# Patient Record
Sex: Female | Born: 1975 | Race: Black or African American | Hispanic: No | Marital: Married | Smoking: Former smoker
Health system: Southern US, Community
[De-identification: ages and names within clinical notes are randomized; demographics above are authoritative.]

## PROBLEM LIST (undated history)

## (undated) DIAGNOSIS — B009 Herpesviral infection, unspecified: Secondary | ICD-10-CM

## (undated) DIAGNOSIS — A749 Chlamydial infection, unspecified: Secondary | ICD-10-CM

## (undated) DIAGNOSIS — K5730 Diverticulosis of large intestine without perforation or abscess without bleeding: Secondary | ICD-10-CM

## (undated) DIAGNOSIS — IMO0002 Reserved for concepts with insufficient information to code with codable children: Secondary | ICD-10-CM

## (undated) DIAGNOSIS — K219 Gastro-esophageal reflux disease without esophagitis: Secondary | ICD-10-CM

## (undated) DIAGNOSIS — D259 Leiomyoma of uterus, unspecified: Secondary | ICD-10-CM

## (undated) DIAGNOSIS — N920 Excessive and frequent menstruation with regular cycle: Secondary | ICD-10-CM

## (undated) DIAGNOSIS — I10 Essential (primary) hypertension: Secondary | ICD-10-CM

## (undated) DIAGNOSIS — Z972 Presence of dental prosthetic device (complete) (partial): Secondary | ICD-10-CM

## (undated) HISTORY — PX: COLONOSCOPY WITH ESOPHAGOGASTRODUODENOSCOPY (EGD): SHX5779

## (undated) HISTORY — PX: TOOTH EXTRACTION: SUR596

## (undated) HISTORY — DX: Essential (primary) hypertension: I10

## (undated) HISTORY — DX: Reserved for concepts with insufficient information to code with codable children: IMO0002

## (undated) HISTORY — DX: Gastro-esophageal reflux disease without esophagitis: K21.9

## (undated) HISTORY — DX: Herpesviral infection, unspecified: B00.9

## (undated) HISTORY — DX: Chlamydial infection, unspecified: A74.9

---

## 1998-08-24 DIAGNOSIS — A749 Chlamydial infection, unspecified: Secondary | ICD-10-CM

## 1998-08-24 DIAGNOSIS — Z8619 Personal history of other infectious and parasitic diseases: Secondary | ICD-10-CM

## 1998-08-24 HISTORY — DX: Personal history of other infectious and parasitic diseases: Z86.19

## 1998-08-24 HISTORY — DX: Chlamydial infection, unspecified: A74.9

## 1998-09-01 ENCOUNTER — Encounter: Payer: Self-pay | Admitting: Emergency Medicine

## 1998-09-01 ENCOUNTER — Emergency Department (HOSPITAL_COMMUNITY): Admission: EM | Admit: 1998-09-01 | Discharge: 1998-09-01 | Payer: Self-pay | Admitting: Emergency Medicine

## 1999-06-16 ENCOUNTER — Emergency Department (HOSPITAL_COMMUNITY): Admission: EM | Admit: 1999-06-16 | Discharge: 1999-06-16 | Payer: Self-pay | Admitting: Emergency Medicine

## 1999-06-16 ENCOUNTER — Encounter: Payer: Self-pay | Admitting: Emergency Medicine

## 1999-08-25 DIAGNOSIS — IMO0002 Reserved for concepts with insufficient information to code with codable children: Secondary | ICD-10-CM

## 1999-08-25 HISTORY — DX: Reserved for concepts with insufficient information to code with codable children: IMO0002

## 2001-05-03 ENCOUNTER — Other Ambulatory Visit: Admission: RE | Admit: 2001-05-03 | Discharge: 2001-05-03 | Payer: Self-pay | Admitting: Obstetrics and Gynecology

## 2001-08-24 DIAGNOSIS — Z87410 Personal history of cervical dysplasia: Secondary | ICD-10-CM

## 2001-08-24 HISTORY — PX: LEEP: SHX91

## 2001-08-24 HISTORY — DX: Personal history of cervical dysplasia: Z87.410

## 2001-11-22 ENCOUNTER — Other Ambulatory Visit: Admission: RE | Admit: 2001-11-22 | Discharge: 2001-11-22 | Payer: Self-pay | Admitting: Obstetrics and Gynecology

## 2002-03-09 ENCOUNTER — Ambulatory Visit (HOSPITAL_COMMUNITY): Admission: RE | Admit: 2002-03-09 | Discharge: 2002-03-09 | Payer: Self-pay | Admitting: Obstetrics and Gynecology

## 2002-03-09 ENCOUNTER — Encounter: Payer: Self-pay | Admitting: Obstetrics and Gynecology

## 2002-03-29 ENCOUNTER — Ambulatory Visit (HOSPITAL_COMMUNITY): Admission: RE | Admit: 2002-03-29 | Discharge: 2002-03-29 | Payer: Self-pay | Admitting: Obstetrics and Gynecology

## 2002-03-29 ENCOUNTER — Encounter: Payer: Self-pay | Admitting: Obstetrics and Gynecology

## 2002-04-20 ENCOUNTER — Other Ambulatory Visit: Admission: RE | Admit: 2002-04-20 | Discharge: 2002-04-20 | Payer: Self-pay | Admitting: Obstetrics and Gynecology

## 2002-06-09 ENCOUNTER — Ambulatory Visit (HOSPITAL_COMMUNITY): Admission: RE | Admit: 2002-06-09 | Discharge: 2002-06-09 | Payer: Self-pay | Admitting: Obstetrics and Gynecology

## 2002-08-11 ENCOUNTER — Inpatient Hospital Stay (HOSPITAL_COMMUNITY): Admission: AD | Admit: 2002-08-11 | Discharge: 2002-08-11 | Payer: Self-pay | Admitting: Obstetrics and Gynecology

## 2002-08-21 ENCOUNTER — Ambulatory Visit (HOSPITAL_COMMUNITY): Admission: RE | Admit: 2002-08-21 | Discharge: 2002-08-21 | Payer: Self-pay | Admitting: Obstetrics and Gynecology

## 2002-08-21 ENCOUNTER — Encounter: Payer: Self-pay | Admitting: Obstetrics and Gynecology

## 2002-08-31 ENCOUNTER — Other Ambulatory Visit: Admission: RE | Admit: 2002-08-31 | Discharge: 2002-08-31 | Payer: Self-pay | Admitting: *Deleted

## 2002-10-25 ENCOUNTER — Ambulatory Visit (HOSPITAL_COMMUNITY): Admission: RE | Admit: 2002-10-25 | Discharge: 2002-10-25 | Payer: Self-pay | Admitting: Obstetrics and Gynecology

## 2002-10-25 ENCOUNTER — Encounter: Payer: Self-pay | Admitting: Obstetrics and Gynecology

## 2002-10-30 ENCOUNTER — Inpatient Hospital Stay (HOSPITAL_COMMUNITY): Admission: AD | Admit: 2002-10-30 | Discharge: 2002-10-30 | Payer: Self-pay | Admitting: Obstetrics and Gynecology

## 2003-01-24 ENCOUNTER — Inpatient Hospital Stay (HOSPITAL_COMMUNITY): Admission: AD | Admit: 2003-01-24 | Discharge: 2003-01-24 | Payer: Self-pay | Admitting: Obstetrics and Gynecology

## 2003-01-29 ENCOUNTER — Ambulatory Visit (HOSPITAL_COMMUNITY): Admission: RE | Admit: 2003-01-29 | Discharge: 2003-01-29 | Payer: Self-pay | Admitting: Obstetrics and Gynecology

## 2003-01-29 ENCOUNTER — Inpatient Hospital Stay (HOSPITAL_COMMUNITY): Admission: AD | Admit: 2003-01-29 | Discharge: 2003-01-29 | Payer: Self-pay | Admitting: Obstetrics and Gynecology

## 2003-01-29 ENCOUNTER — Encounter: Payer: Self-pay | Admitting: Obstetrics and Gynecology

## 2003-01-30 ENCOUNTER — Inpatient Hospital Stay (HOSPITAL_COMMUNITY): Admission: AD | Admit: 2003-01-30 | Discharge: 2003-01-30 | Payer: Self-pay | Admitting: Obstetrics and Gynecology

## 2003-03-07 ENCOUNTER — Inpatient Hospital Stay (HOSPITAL_COMMUNITY): Admission: AD | Admit: 2003-03-07 | Discharge: 2003-03-10 | Payer: Self-pay | Admitting: Obstetrics and Gynecology

## 2003-03-09 ENCOUNTER — Encounter: Payer: Self-pay | Admitting: Obstetrics and Gynecology

## 2003-05-30 ENCOUNTER — Other Ambulatory Visit: Admission: RE | Admit: 2003-05-30 | Discharge: 2003-05-30 | Payer: Self-pay | Admitting: Obstetrics and Gynecology

## 2004-07-04 ENCOUNTER — Other Ambulatory Visit: Admission: RE | Admit: 2004-07-04 | Discharge: 2004-07-04 | Payer: Self-pay | Admitting: Obstetrics and Gynecology

## 2005-02-06 ENCOUNTER — Other Ambulatory Visit: Admission: RE | Admit: 2005-02-06 | Discharge: 2005-02-06 | Payer: Self-pay | Admitting: Obstetrics and Gynecology

## 2005-04-06 ENCOUNTER — Encounter: Admission: RE | Admit: 2005-04-06 | Discharge: 2005-04-06 | Payer: Self-pay | Admitting: Internal Medicine

## 2005-09-29 ENCOUNTER — Other Ambulatory Visit: Admission: RE | Admit: 2005-09-29 | Discharge: 2005-09-29 | Payer: Self-pay | Admitting: Obstetrics and Gynecology

## 2012-09-07 ENCOUNTER — Encounter: Payer: Self-pay | Admitting: Obstetrics and Gynecology

## 2012-09-12 ENCOUNTER — Encounter: Payer: Self-pay | Admitting: Obstetrics and Gynecology

## 2012-09-12 ENCOUNTER — Ambulatory Visit: Payer: Managed Care, Other (non HMO) | Admitting: Obstetrics and Gynecology

## 2012-09-12 VITALS — BP 122/82 | HR 70 | Resp 16 | Ht 69.0 in | Wt 217.0 lb

## 2012-09-12 DIAGNOSIS — Z124 Encounter for screening for malignant neoplasm of cervix: Secondary | ICD-10-CM

## 2012-09-12 NOTE — Progress Notes (Signed)
Last Pap 05/27/2011 WNL: Yes Regular Periods:yes Contraception: None  Monthly Breast exam:no Tetanus<79yrs:no Nl.Bladder Function:yes Daily BMs:yes Healthy Diet:yes Calcium:no Mammogram:no Date of Mammogram: None Exercise:no Have often Exercise: None Seatbelt: yes Abuse at home: no Stressful work:no Sigmoid-colonoscopy: None Bone Density: No PCP: Shaune Pollack Change in PMH: None Change in ZOX:WRUEAV DX with Colon Cancer 10/2011 BP 122/82  Pulse 70  Resp 16  Ht 5\' 9"  (1.753 m)  Wt 217 lb (98.431 kg)  BMI 32.05 kg/m2  LMP 08/26/2012 Pt with complaints:yes and pt is trying to concieve.  No BC for one year.  IC 2-3 times per week.  No lubricants Physical Examination: General appearance - alert, well appearing, and in no distress Mental status - normal mood, behavior, speech, dress, motor activity, and thought processes Neck - supple, no significant adenopathy,  thyroid exam: thyroid is normal in size without nodules or tenderness Chest - clear to auscultation, no wheezes, rales or rhonchi, symmetric air entry Heart - normal rate and regular rhythm Abdomen - soft, nontender, nondistended, no masses or organomegaly Breasts - breasts appear normal, no suspicious masses, no skin or nipple changes or axillary nodes Pelvic - normal external genitalia, vulva, vagina, cervix, uterus and adnexa Rectal - rectal exam not indicated Back exam - full range of motion, no tenderness, palpable spasm or pain on motion Neurological - alert, oriented, normal speech, no focal findings or movement disorder noted Musculoskeletal - no joint tenderness, deformity or swelling Extremities - no edema, redness or tenderness in the calves or thighs Skin - normal coloration and turgor, no rashes, no suspicious skin lesions noted Routine exam Pap sent h/o CIN 3 Mammogram due no nothing used for contraception Call with menses to schedule day 21 labs, FBS, insulin, tsh and progesterone Schedule HSG.  Rt one  week after labs drawn Do SA RT will schedule

## 2012-09-13 LAB — PAP IG, CT-NG, RFX HPV ASCU

## 2012-09-19 ENCOUNTER — Telehealth: Payer: Self-pay

## 2012-09-19 MED ORDER — METRONIDAZOLE 0.75 % VA GEL: VAGINAL | Status: DC

## 2012-09-19 NOTE — Telephone Encounter (Signed)
Spoke with pt informed labs rx sent to pharm for BV pt voice understanding

## 2012-09-19 NOTE — Telephone Encounter (Signed)
Message copied by Rolla Plate on Mon Sep 19, 2012 10:13 AM ------      Message from: Jaymes Graff      Created: Tue Sep 13, 2012  5:35 PM       Please tell pt BV was found on her pap smear.  She can be treated with either Metrogel one applicator in vagina QHS for five nights or Flagyl 500mg  one tablet twice a day for seven days.

## 2012-09-26 ENCOUNTER — Telehealth: Payer: Self-pay | Admitting: Obstetrics and Gynecology

## 2012-09-26 ENCOUNTER — Other Ambulatory Visit: Payer: Self-pay | Admitting: Obstetrics and Gynecology

## 2012-09-26 DIAGNOSIS — N971 Female infertility of tubal origin: Secondary | ICD-10-CM

## 2012-09-26 DIAGNOSIS — N970 Female infertility associated with anovulation: Secondary | ICD-10-CM

## 2012-09-26 NOTE — Telephone Encounter (Signed)
ND pt 

## 2012-09-26 NOTE — Telephone Encounter (Signed)
Spoke with pt rgd msg pt states started cycle 09/25/12 pt to call first day of cycle pt sch for HSG 10/04/12 at 8:30 at [facility] hospital pt day 21 labs schd 10/15/12 advised pt can go to [facility]  [address] since d 21 falls on Saturday orders faxed to lab and mailed to pt pt voice understanding

## 2012-09-26 NOTE — Telephone Encounter (Signed)
Lm on vm tcb rgd msg 

## 2012-10-04 ENCOUNTER — Ambulatory Visit (HOSPITAL_COMMUNITY)
Admission: RE | Admit: 2012-10-04 | Discharge: 2012-10-04 | Disposition: A | Discharge status: Home / Self Care | Payer: Managed Care, Other (non HMO) | Source: Ambulatory Visit | Attending: Obstetrics and Gynecology | Admitting: Obstetrics and Gynecology

## 2012-10-04 ENCOUNTER — Ambulatory Visit (HOSPITAL_COMMUNITY): Payer: Self-pay

## 2012-10-04 DIAGNOSIS — N979 Female infertility, unspecified: Secondary | ICD-10-CM | POA: Insufficient documentation

## 2012-10-04 DIAGNOSIS — N970 Female infertility associated with anovulation: Secondary | ICD-10-CM

## 2012-10-04 MED ORDER — IOHEXOL 300 MG/ML  SOLN
20.0000 mL | Freq: Once | INTRAMUSCULAR | Status: AC | PRN
  Administered 2012-10-04: 8 mL

## 2012-10-11 ENCOUNTER — Telehealth: Payer: Self-pay

## 2012-10-11 NOTE — Telephone Encounter (Signed)
Message copied by Rolla Plate on Tue Oct 11, 2012  9:16 AM ------      Message from: Jaymes Graff      Created: Wed Oct 05, 2012 12:26 AM       Please make sure the pt comes back for follow up as scheduled ------

## 2012-10-11 NOTE — Telephone Encounter (Signed)
Lm on vm tcb rgd appt

## 2012-10-12 NOTE — Telephone Encounter (Signed)
Spoke with pt rgd f/u appt to discuss infertility pt has appt 10/20/12 at 3:15 with nd pt voice understanding

## 2012-10-15 ENCOUNTER — Other Ambulatory Visit: Payer: Self-pay | Admitting: Obstetrics and Gynecology

## 2012-10-16 LAB — INSULIN, FASTING: Insulin fasting, serum: 21 u[IU]/mL (ref 3–28)

## 2012-10-20 ENCOUNTER — Ambulatory Visit: Payer: Managed Care, Other (non HMO) | Admitting: Obstetrics and Gynecology

## 2012-10-20 ENCOUNTER — Encounter: Payer: Self-pay | Admitting: Obstetrics and Gynecology

## 2012-10-20 VITALS — BP 118/76 | Ht 69.0 in | Wt 216.0 lb

## 2012-10-20 DIAGNOSIS — Z3169 Encounter for other general counseling and advice on procreation: Secondary | ICD-10-CM

## 2012-10-20 NOTE — Progress Notes (Signed)
F/u labs infertility BP 118/76  Ht 5\' 9"  (1.753 m)  Wt 216 lb (97.977 kg)  BMI 31.88 kg/m2  LMP 09/25/2012 Results for orders placed in visit on 10/15/12  INSULIN, FASTING      Result Value Range   Insulin fasting, serum 21  3 - 28 uIU/mL  day 21 progesterone 19.4 FBS 82 TSH WNL HSG normal uterus.  Normal left tube with spill.  Right tube with spill but dilated Infertility Still waiting on SA Discussed clomid with and without IUI Discussed L/S with chromopertubation and poss salpingectomy Pt will call with decision Pt with some insulin resistance but she declined metformin

## 2014-05-21 ENCOUNTER — Other Ambulatory Visit: Payer: Self-pay | Admitting: Obstetrics and Gynecology

## 2014-06-25 ENCOUNTER — Encounter: Payer: Self-pay | Admitting: Obstetrics and Gynecology

## 2014-07-11 ENCOUNTER — Other Ambulatory Visit: Payer: Self-pay | Admitting: Obstetrics and Gynecology

## 2014-07-13 ENCOUNTER — Other Ambulatory Visit (HOSPITAL_COMMUNITY): Payer: Self-pay | Admitting: Obstetrics and Gynecology

## 2014-07-13 NOTE — H&P (Signed)
Ashley Wagner is a 38 y.o. female P 1-0-0-1 presents for hysteroscopy, diliatation and  curettage because of menorrhagia.  The patient, previously on oral contraceptives, stopped them two years ago in preparation to conceive. Since that time she's had worsening menstrual flow lasting for 7 days with pad change every 1-2 hours with clots. On occasion she will spot as well as post coitally.  She reports cramping that she rates as a 7/10 on a 10 point pain scale with no relief from over the counter analgesia.  She denies any vaginitis symptoms, changes in bowel or bladder function or dyspareunia.  A Sono-hysterogram done in September 2015 showed a uterus: 9.04 x 6.36 x 5.51 cm;   #2 fibroids: 1.1 and 7 mm;  an irregular endometrium with a 1 cm area of thickness possibly related to  C-section scar;  with saline infusion several polyps were noted on the posterior side of the endometrial wall with the largest measuring 1.8 cm;   endometrial cavity was 3.9 x 2.2 cm;    both ovaries appeared normal on this study.  An endometrial biopsy done at the same time showed: benign secretory endometrium with no malgnancy or hyperplasia. A review management options were given the patient, in view of the  radiographic findings and her desire to conceive.  After careful consideration she has chosen to proceed with hysteroscopy, dilatation and curettage.   Past Medical History    OB History: G: 1;  P 1-0-0-1;  C-section 2004  GYN History: menarche: 38 YO    LMP: 06/24/2014    Contracepton no method  The patient reports a past history of: gonorrhea and herpes.  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   ROS: Admits to glasses but  denies headache, vision changes, nasal congestion, dysphagia, tinnitus, dizziness, hoarseness, cough,  chest pain, shortness of breath, nausea, vomiting, diarrhea,constipation,  urinary frequency, urgency  dysuria, hematuria, vaginitis symptoms, pelvic pain, swelling of joints,easy bruising,  myalgias, arthralgias, skin rashes, unexplained weight loss and except as is mentioned in the history of present illness, patient's review of systems is otherwise negative.   Physical Exam  Bp: 130/86   Weight: 210 lbs   Height: 5\' 8"   BMI: 31.9 Neck: supple without masses or thyromegaly Lungs: clear to auscultation Heart: regular rate and rhythm Abdomen: soft, mildly ttender  without guarding and no organomegaly Pelvic:EGBUS- wnl; vagina-normal rugae; uterus-upper limits of normal size and mildly tender, cervix without lesions or motion tenderness; adnexae-no tenderness or masses Extremities:  no clubbing, cyanosis or edema   Assesment:  Menorrhagia                       Endometrial Polyps   Disposition:  A discussion was held with patient regarding the indication for her procedure(s) along with the risks, which include but are not limited to: reaction to anesthesia, damage to adjacent organs, infection and excessive bleeding. The patient verbalized understanding of these  risks and has consented to proceed with a Hysteroscopy, Dilatation and Curettage with Tru-Clear  at [facility] on August 03, 2014 at 10 a.m.  CSN# [id number]   Justn Quale J. Florene Glen, PA-C  for Dr. Franklyn Lor. Dillard

## 2014-08-22 ENCOUNTER — Other Ambulatory Visit: Payer: Self-pay | Admitting: Obstetrics and Gynecology

## 2014-08-22 ENCOUNTER — Encounter (HOSPITAL_COMMUNITY): Payer: Self-pay | Admitting: Obstetrics and Gynecology

## 2014-08-27 ENCOUNTER — Other Ambulatory Visit (HOSPITAL_COMMUNITY): Payer: Self-pay | Admitting: Obstetrics and Gynecology

## 2014-08-27 NOTE — H&P (Signed)
Ashley Wagner is a 39 y.o.  female P 1-0-0-1 presents for hysteroscopy,  and removal of endometrial masses,  because of menorrhagia and endometrial polyps. Over the years the patient has had increasing menstrual flow that will last for 7 days, accompanied by clots and require the change of a pad every 2 hours.  Additionally she will have cramping rated 3-10/10 on a 10 point pain scale but is relieved with over the counter NSAIDs.  She goes on to report post coital spotting lasting 1-3 days that is also accompanied by some cramping.  She denies, howver, non-bleeding  related pelvic pain, dyspareunia,  urinary or bowel changes.  A pelvic ultrasound/sono-hysterogram in September 2015 showed a uterus: 9.04 x 6.36 x 5.51 cm with # 2 fibroids: 1.1 cm and 7 mm.  The endometrium, measured  1.10 cm was irregular with thickness of a prior C-section scar measuring 1 cm.  With saline infusion several polyps on the posterior side of the endometrial wall were seen and the largest measuring 1.8 cm.  The endometrial cavity measured: 3.9 x 2.2 cm and  both ovaries appeared normal.  An endometrial biopsy done at the same time returned benign secretory endometrium with no malignancy, aypia or hyperplasia. Last year a TSH was normal.  The patient was given both medical and surgical management options for her symptoms and endometrial polyps however, she wants to proceed with surgical removal of her endometrial polyps.   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  Abnormal PAP smear 2003 treated with LEEP-normal since   Last PAP smear: 03/2014  Medical History: Hypertension, Insulin Resistance, Infertility  Surgical History: 2003 Loop Electrosurgical Excision Procedure Denies history of blood transfusions;  reports that previous epidural with childbirth didn't work and  was difficult to awaken from general anesthesia  Family History: Colon Cancer and Hypertension  Social History: Married and employed with [employer];  Former Smoker and Occasional Alcohol   Medications: Valacyclovir 500 mg bid x 5 days prn Naproxen 550 mg bid pc prn Flonase 2 sprays per nostril as directed  No Known Allergies   Denies sensitivity to peanuts, shellfish, soy, latex or adhesives.   ROS: Admits to glasses, upper dentures, right ear tinnitus but  denies headache, vision changes, nasal congestion, dysphagia, dizziness, hoarseness, cough,  chest pain, shortness of breath, nausea, vomiting, diarrhea,constipation,  urinary frequency, urgency  dysuria, hematuria, vaginitis symptoms, pelvic pain, swelling of joints,easy bruising,  myalgias, arthralgias, skin rashes, unexplained weight loss and except as is mentioned in the history of present illness, patient's review of systems is otherwise negative.   Physical Exam  Bp: 132/78    P: 64   R: 20  Temperature: 98.4 degrees F orally  Weight: 219 lbs.  Height: 5\' 8"    BMI: 33.3  Neck: supple without masses or thyromegaly Lungs: clear to auscultation Heart: regular rate and rhythm Abdomen: soft, non-tender and no organomegaly Pelvic:EGBUS- wnl; vagina-normal rugae; uterus-normal size, cervix without lesions or motion tenderness; adnexae-no tenderness or masses Extremities:  no clubbing, cyanosis or edema   Assesment: Menorrhagia                       Endometrial Polyps   Disposition:  A discussion was held with patient regarding the indication for her procedure(s) along with the  risks, which include but are not limited to: reaction to anesthesia, damage to adjacent organs to include perforation, scarring of endometrium,  infection and excessive bleeding. The patient verbalized understanding of these risks and has consented to proceed with Hysteroscopy, Dilatiation, Curettage, and Resection of Endometrial Polyps with Tru  Clear on September 07, 2014 at [place].   CSN# [id number]   [name] J. Florene Glen, PA-C  for Dr.Naima A. Dillard

## 2014-08-30 ENCOUNTER — Encounter (HOSPITAL_COMMUNITY): Payer: Self-pay | Admitting: *Deleted

## 2014-09-07 ENCOUNTER — Encounter (HOSPITAL_COMMUNITY): Payer: Self-pay | Admitting: Anesthesiology

## 2014-09-07 ENCOUNTER — Ambulatory Visit (HOSPITAL_COMMUNITY): Payer: BLUE CROSS/BLUE SHIELD | Admitting: Anesthesiology

## 2014-09-07 ENCOUNTER — Ambulatory Visit (HOSPITAL_COMMUNITY)
Admission: RE | Admit: 2014-09-07 | Discharge: 2014-09-07 | Disposition: A | Discharge status: Home / Self Care | Payer: BLUE CROSS/BLUE SHIELD | Source: Ambulatory Visit | Attending: Obstetrics and Gynecology | Admitting: Obstetrics and Gynecology

## 2014-09-07 ENCOUNTER — Encounter (HOSPITAL_COMMUNITY)
Admission: RE | Disposition: A | Discharge status: Home / Self Care | Payer: Self-pay | Source: Ambulatory Visit | Attending: Obstetrics and Gynecology

## 2014-09-07 DIAGNOSIS — N840 Polyp of corpus uteri: Secondary | ICD-10-CM | POA: Diagnosis not present

## 2014-09-07 DIAGNOSIS — I10 Essential (primary) hypertension: Secondary | ICD-10-CM | POA: Diagnosis not present

## 2014-09-07 DIAGNOSIS — Z87891 Personal history of nicotine dependence: Secondary | ICD-10-CM | POA: Diagnosis not present

## 2014-09-07 DIAGNOSIS — E669 Obesity, unspecified: Secondary | ICD-10-CM | POA: Insufficient documentation

## 2014-09-07 DIAGNOSIS — Z8249 Family history of ischemic heart disease and other diseases of the circulatory system: Secondary | ICD-10-CM | POA: Insufficient documentation

## 2014-09-07 DIAGNOSIS — Z79899 Other long term (current) drug therapy: Secondary | ICD-10-CM | POA: Insufficient documentation

## 2014-09-07 DIAGNOSIS — Z6831 Body mass index (BMI) 31.0-31.9, adult: Secondary | ICD-10-CM | POA: Diagnosis not present

## 2014-09-07 DIAGNOSIS — N920 Excessive and frequent menstruation with regular cycle: Secondary | ICD-10-CM | POA: Diagnosis present

## 2014-09-07 HISTORY — PX: DILATATION & CURETTAGE/HYSTEROSCOPY WITH TRUECLEAR: SHX6353

## 2014-09-07 LAB — CBC
HEMATOCRIT: 40 % (ref 36.0–46.0)
Hemoglobin: 12.9 g/dL (ref 12.0–15.0)
MCH: 30.1 pg (ref 26.0–34.0)
MCHC: 32.3 g/dL (ref 30.0–36.0)
MCV: 93.2 fL (ref 78.0–100.0)
Platelets: 288 10*3/uL (ref 150–400)
RBC: 4.29 MIL/uL (ref 3.87–5.11)
RDW: 14 % (ref 11.5–15.5)
WBC: 9 10*3/uL (ref 4.0–10.5)

## 2014-09-07 LAB — PREGNANCY, URINE: Preg Test, Ur: NEGATIVE

## 2014-09-07 SURGERY — DILATATION & CURETTAGE/HYSTEROSCOPY WITH TRUCLEAR
Anesthesia: General | Site: Vagina

## 2014-09-07 MED ORDER — SCOPOLAMINE 1 MG/3DAYS TD PT72
1.0000 | MEDICATED_PATCH | Freq: Once | TRANSDERMAL | Status: DC
  Administered 2014-09-07: 1.5 mg via TRANSDERMAL

## 2014-09-07 MED ORDER — LACTATED RINGERS IV SOLN
INTRAVENOUS | Status: DC
  Administered 2014-09-07 (×2): via INTRAVENOUS

## 2014-09-07 MED ORDER — FENTANYL CITRATE 0.05 MG/ML IJ SOLN
INTRAMUSCULAR | Status: DC | PRN
  Administered 2014-09-07 (×5): 50 ug via INTRAVENOUS

## 2014-09-07 MED ORDER — SCOPOLAMINE 1 MG/3DAYS TD PT72
MEDICATED_PATCH | TRANSDERMAL | Status: AC
  Administered 2014-09-07: 1.5 mg via TRANSDERMAL
  Filled 2014-09-07: qty 1

## 2014-09-07 MED ORDER — LIDOCAINE HCL 2 % IJ SOLN
INTRAMUSCULAR | Status: AC
  Filled 2014-09-07: qty 20

## 2014-09-07 MED ORDER — MIDAZOLAM HCL 2 MG/2ML IJ SOLN
INTRAMUSCULAR | Status: DC | PRN
  Administered 2014-09-07: 2 mg via INTRAVENOUS

## 2014-09-07 MED ORDER — DOXYCYCLINE HYCLATE 50 MG PO CAPS: 100.0000 mg | ORAL_CAPSULE | Freq: Two times a day (BID) | ORAL | Status: AC

## 2014-09-07 MED ORDER — FENTANYL CITRATE 0.05 MG/ML IJ SOLN
INTRAMUSCULAR | Status: AC
  Filled 2014-09-07: qty 2

## 2014-09-07 MED ORDER — MIDAZOLAM HCL 2 MG/2ML IJ SOLN
INTRAMUSCULAR | Status: AC
  Filled 2014-09-07: qty 2

## 2014-09-07 MED ORDER — FENTANYL CITRATE 0.05 MG/ML IJ SOLN
25.0000 ug | INTRAMUSCULAR | Status: DC | PRN
  Administered 2014-09-07: 50 ug via INTRAVENOUS

## 2014-09-07 MED ORDER — LIDOCAINE HCL 2 % IJ SOLN
INTRAMUSCULAR | Status: DC | PRN
  Administered 2014-09-07: 19 mL

## 2014-09-07 MED ORDER — DEXAMETHASONE SODIUM PHOSPHATE 10 MG/ML IJ SOLN
INTRAMUSCULAR | Status: AC
  Filled 2014-09-07: qty 1

## 2014-09-07 MED ORDER — DEXAMETHASONE SODIUM PHOSPHATE 4 MG/ML IJ SOLN
INTRAMUSCULAR | Status: DC | PRN
  Administered 2014-09-07: 4 mg via INTRAVENOUS

## 2014-09-07 MED ORDER — LIDOCAINE HCL (CARDIAC) 20 MG/ML IV SOLN
INTRAVENOUS | Status: AC
  Filled 2014-09-07: qty 5

## 2014-09-07 MED ORDER — METOCLOPRAMIDE HCL 5 MG/ML IJ SOLN: 10.0000 mg | Freq: Once | INTRAMUSCULAR | Status: DC | PRN

## 2014-09-07 MED ORDER — SILVER NITRATE-POT NITRATE 75-25 % EX MISC
CUTANEOUS | Status: AC
  Filled 2014-09-07: qty 1

## 2014-09-07 MED ORDER — PROPOFOL 10 MG/ML IV BOLUS
INTRAVENOUS | Status: AC
  Filled 2014-09-07: qty 20

## 2014-09-07 MED ORDER — MEPERIDINE HCL 25 MG/ML IJ SOLN: 6.2500 mg | INTRAMUSCULAR | Status: DC | PRN

## 2014-09-07 MED ORDER — PROPOFOL 10 MG/ML IV BOLUS
INTRAVENOUS | Status: DC | PRN
  Administered 2014-09-07: 200 mg via INTRAVENOUS

## 2014-09-07 MED ORDER — FENTANYL CITRATE 0.05 MG/ML IJ SOLN
INTRAMUSCULAR | Status: AC
  Filled 2014-09-07: qty 5

## 2014-09-07 MED ORDER — LIDOCAINE HCL (CARDIAC) 20 MG/ML IV SOLN
INTRAVENOUS | Status: DC | PRN
  Administered 2014-09-07: 80 mg via INTRAVENOUS

## 2014-09-07 MED ORDER — HYDROCODONE-ACETAMINOPHEN 5-325 MG PO TABS: 1.0000 | ORAL_TABLET | Freq: Four times a day (QID) | ORAL | Status: DC | PRN

## 2014-09-07 MED ORDER — KETOROLAC TROMETHAMINE 30 MG/ML IJ SOLN
INTRAMUSCULAR | Status: DC | PRN
  Administered 2014-09-07: 30 mg via INTRAVENOUS

## 2014-09-07 MED ORDER — ONDANSETRON HCL 4 MG/2ML IJ SOLN
INTRAMUSCULAR | Status: AC
Start: 2014-09-07 — End: 2014-09-07
  Filled 2014-09-07: qty 2

## 2014-09-07 MED ORDER — ONDANSETRON HCL 4 MG/2ML IJ SOLN
INTRAMUSCULAR | Status: DC | PRN
  Administered 2014-09-07: 4 mg via INTRAVENOUS

## 2014-09-07 SURGICAL SUPPLY — 17 items
BLADE INCISOR TRUC PLUS 2.9 (ABLATOR) ×1 IMPLANT
CANISTERS HI-FLOW 3000CC (CANNISTER) ×2 IMPLANT
CATH ROBINSON RED A/P 16FR (CATHETERS) ×2 IMPLANT
CLOTH BEACON ORANGE TIMEOUT ST (SAFETY) ×2 IMPLANT
CONTAINER PREFILL 10% NBF 60ML (FORM) ×4 IMPLANT
GLOVE BIO SURGEON STRL SZ 6.5 (GLOVE) ×2 IMPLANT
GLOVE BIOGEL PI IND STRL 7.0 (GLOVE) ×1 IMPLANT
GLOVE BIOGEL PI INDICATOR 7.0 (GLOVE) ×1
GOWN STRL REUS W/TWL LRG LVL3 (GOWN DISPOSABLE) ×4 IMPLANT
INCISOR TRUC PLUS BLADE 2.9 (ABLATOR) ×2
KIT HYSTEROSCOPY TRUCLEAR (ABLATOR) ×2 IMPLANT
MORCELLATOR RECIP TRUCLEAR 4.0 (ABLATOR) IMPLANT
PACK VAGINAL MINOR WOMEN LF (CUSTOM PROCEDURE TRAY) ×2 IMPLANT
PAD OB MATERNITY 4.3X12.25 (PERSONAL CARE ITEMS) ×2 IMPLANT
SYR 20CC LL (SYRINGE) ×2 IMPLANT
TOWEL OR 17X24 6PK STRL BLUE (TOWEL DISPOSABLE) ×4 IMPLANT
WATER STERILE IRR 1000ML POUR (IV SOLUTION) ×2 IMPLANT

## 2014-09-07 NOTE — Anesthesia Procedure Notes (Signed)
Procedure Name: LMA Insertion Date/Time: 09/07/2014 9:34 AM Performed by: Flossie Dibble Pre-anesthesia Checklist: Patient identified, Timeout performed, Emergency Drugs available, Suction available and Patient being monitored Patient Re-evaluated:Patient Re-evaluated prior to inductionOxygen Delivery Method: Circle system utilized Preoxygenation: Pre-oxygenation with 100% oxygen Intubation Type: IV induction LMA Size: 4.0 Number of attempts: 1 Placement Confirmation: breath sounds checked- equal and bilateral and positive ETCO2 Tube secured with: Tape Dental Injury: Teeth and Oropharynx as per pre-operative assessment

## 2014-09-07 NOTE — H&P (View-Only) (Signed)
Ashley Wagner is a 39 y.o.  female P 1-0-0-1 presents for hysteroscopy,  and removal of endometrial masses,  because of menorrhagia and endometrial polyps. Over the years the patient has had increasing menstrual flow that will last for 7 days, accompanied by clots and require the change of a pad every 2 hours.  Additionally she will have cramping rated 3-10/10 on a 10 point pain scale but is relieved with over the counter NSAIDs.  She goes on to report post coital spotting lasting 1-3 days that is also accompanied by some cramping.  She denies, howver, non-bleeding  related pelvic pain, dyspareunia,  urinary or bowel changes.  A pelvic ultrasound/sono-hysterogram in September 2015 showed a uterus: 9.04 x 6.36 x 5.51 cm with # 2 fibroids: 1.1 cm and 7 mm.  The endometrium, measured  1.10 cm was irregular with thickness of a prior C-section scar measuring 1 cm.  With saline infusion several polyps on the posterior side of the endometrial wall were seen and the largest measuring 1.8 cm.  The endometrial cavity measured: 3.9 x 2.2 cm and  both ovaries appeared normal.  An endometrial biopsy done at the same time returned benign secretory endometrium with no malignancy, aypia or hyperplasia. Last year a TSH was normal.  The patient was given both medical and surgical management options for her symptoms and endometrial polyps however, she wants to proceed with surgical removal of her endometrial polyps.   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  Abnormal PAP smear 2003 treated with LEEP-normal since   Last PAP smear: 03/2014  Medical History: Hypertension, Insulin Resistance, Infertility  Surgical History: 2003 Loop Electrosurgical Excision Procedure Denies history of blood transfusions;  reports that previous epidural with childbirth didn't work and  was difficult to awaken from general anesthesia  Family History: Colon Cancer and Hypertension  Social History: Married and employed with [employer];  Former Smoker and Occasional Alcohol   Medications: Valacyclovir 500 mg bid x 5 days prn Naproxen 550 mg bid pc prn Flonase 2 sprays per nostril as directed  No Known Allergies   Denies sensitivity to peanuts, shellfish, soy, latex or adhesives.   ROS: Admits to glasses, upper dentures, right ear tinnitus but  denies headache, vision changes, nasal congestion, dysphagia, dizziness, hoarseness, cough,  chest pain, shortness of breath, nausea, vomiting, diarrhea,constipation,  urinary frequency, urgency  dysuria, hematuria, vaginitis symptoms, pelvic pain, swelling of joints,easy bruising,  myalgias, arthralgias, skin rashes, unexplained weight loss and except as is mentioned in the history of present illness, patient's review of systems is otherwise negative.   Physical Exam  Bp: 132/78    P: 64   R: 20  Temperature: 98.4 degrees F orally  Weight: 219 lbs.  Height: 5\' 8"    BMI: 33.3  Neck: supple without masses or thyromegaly Lungs: clear to auscultation Heart: regular rate and rhythm Abdomen: soft, non-tender and no organomegaly Pelvic:EGBUS- wnl; vagina-normal rugae; uterus-normal size, cervix without lesions or motion tenderness; adnexae-no tenderness or masses Extremities:  no clubbing, cyanosis or edema   Assesment: Menorrhagia                       Endometrial Polyps   Disposition:  A discussion was held with patient regarding the indication for her procedure(s) along with the  risks, which include but are not limited to: reaction to anesthesia, damage to adjacent organs to include perforation, scarring of endometrium,  infection and excessive bleeding. The patient verbalized understanding of these risks and has consented to proceed with Hysteroscopy, Dilatiation, Curettage, and Resection of Endometrial Polyps with Tru  Clear on September 07, 2014 at [place].   CSN# [id number]   Ashley Natividad J. Florene Glen, PA-C  for Dr.Naima A. Dillard

## 2014-09-07 NOTE — Discharge Instructions (Signed)
Hysteroscopy Hysteroscopy is a procedure used for looking inside the womb (uterus). It may be done for various reasons, including:  To evaluate abnormal bleeding, fibroid (benign, noncancerous) tumors, polyps, scar tissue (adhesions), and possibly cancer of the uterus.  To look for lumps (tumors) and other uterine growths.  To look for causes of why a woman cannot get pregnant (infertility), causes of recurrent loss of pregnancy (miscarriages), or a lost intrauterine device (IUD).  To perform a sterilization by blocking the fallopian tubes from inside the uterus. In this procedure, a thin, flexible tube with a tiny light and camera on the end of it (hysteroscope) is used to look inside the uterus. A hysteroscopy should be done right after a menstrual period to be sure you are not pregnant. LET [facility] CARE PROVIDER KNOW ABOUT:   Any allergies you have.  All medicines you are taking, including vitamins, herbs, eye drops, creams, and over-the-counter medicines.  Previous problems you or members of your family have had with the use of anesthetics.  Any blood disorders you have.  Previous surgeries you have had.  Medical conditions you have. RISKS AND COMPLICATIONS  Generally, this is a safe procedure. However, as with any procedure, complications can occur. Possible complications include:  Putting a hole in the uterus.  Excessive bleeding.  Infection.  Damage to the cervix.  Injury to other organs.  Allergic reaction to medicines.  Too much fluid used in the uterus for the procedure. BEFORE THE PROCEDURE   Ask your health care provider about changing or stopping any regular medicines.  Do not take aspirin or blood thinners for 1 week before the procedure, or as directed by your health care provider. These can cause bleeding.  If you smoke, do not smoke for 2 weeks before the procedure.  In some cases, a medicine is placed in the cervix the day before the procedure.  This medicine makes the cervix have a larger opening (dilate). This makes it easier for the instrument to be inserted into the uterus during the procedure.  Do not eat or drink anything for at least 8 hours before the surgery.  Arrange for someone to take you home after the procedure. PROCEDURE   You may be given a medicine to relax you (sedative). You may also be given one of the following:  A medicine that numbs the area around the cervix (local anesthetic).  A medicine that makes you sleep through the procedure (general anesthetic).  The hysteroscope is inserted through the vagina into the uterus. The camera on the hysteroscope sends a picture to a TV screen. This gives the surgeon a good view inside the uterus.  During the procedure, air or a liquid is put into the uterus, which allows the surgeon to see better.  Sometimes, tissue is gently scraped from inside the uterus. These tissue samples are sent to a lab for testing. AFTER THE PROCEDURE   If you had a general anesthetic, you may be groggy for a couple hours after the procedure.  If you had a local anesthetic, you will be able to go home as soon as you are stable and feel ready.  You may have some cramping. This normally lasts for a couple days.  You may have bleeding, which varies from light spotting for a few days to menstrual-like bleeding for 3-7 days. This is normal.  If your test results are not back during the visit, make an appointment with your health care provider to find out the  results. Document Released: 11/16/2000 Document Revised: 05/31/2013 Document Reviewed: 03/09/2013 [facility] Patient Information 2015 [place], [place]. This information is not intended to replace advice given to you by your health care provider. Make sure you discuss any questions you have with your health care provider. DISCHARGE INSTRUCTIONS: D&C / D&E The following instructions have been prepared to help you care for yourself upon your  return home.   Personal hygiene:  Use sanitary pads for vaginal drainage, not tampons.  Shower the day after your procedure.  NO tub baths, pools or Jacuzzis for 2-3 weeks.  Wipe front to back after using the bathroom.  Activity and limitations:  Do NOT drive or operate any equipment for 24 hours. The effects of anesthesia are still present and drowsiness may result.  Do NOT rest in bed all day.  Walking is encouraged.  Walk up and down stairs slowly.  You may resume your normal activity in one to two days or as indicated by your physician.  Sexual activity: NO intercourse for at least 2 weeks after the procedure, or as indicated by your physician.  Diet: Eat a light meal as desired this evening. You may resume your usual diet tomorrow.  Return to work: You may resume your work activities in one to two days or as indicated by your doctor.  What to expect after your surgery: Expect to have vaginal bleeding/discharge for 2-3 days and spotting for up to 10 days. It is not unusual to have soreness for up to 1-2 weeks. You may have a slight burning sensation when you urinate for the first day. Mild cramps may continue for a couple of days. You may have a regular period in 2-6 weeks.  Call your doctor for any of the following:  Excessive vaginal bleeding, saturating and changing one pad every hour.  Inability to urinate 6 hours after discharge from hospital.  Pain not relieved by pain medication.  Fever of 100.4 F or greater.  Unusual vaginal discharge or odor.   Call for an appointment:    Patients signature: ______________________  Nurses signature ________________________  Support person's signature_______________________

## 2014-09-07 NOTE — Interval H&P Note (Signed)
History and Physical Interval Note:  09/07/2014 9:14 AM  Ashley Wagner  has presented today for surgery, with the diagnosis of Menorrhagia  The various methods of treatment have been discussed with the patient and family. After consideration of risks, benefits and other options for treatment, the patient has consented to  Procedure(s): [place] (N/A) as a surgical intervention .  The patient's history has been reviewed, patient examined, no change in status, stable for surgery.  I have reviewed the patient's chart and labs.  Questions were answered to the patient's satisfaction.     [facility]

## 2014-09-07 NOTE — Anesthesia Preprocedure Evaluation (Addendum)
Anesthesia Evaluation  Patient identified by MRN, date of birth, ID band Patient awake    Reviewed: Allergy & Precautions, NPO status , Patient's Chart, lab work & pertinent test results  Airway Mallampati: I  TM Distance: >3 FB Neck ROM: Full    Dental no notable dental hx. (+) Chipped,    Pulmonary neg pulmonary ROS,  breath sounds clear to auscultation  Pulmonary exam normal       Cardiovascular negative cardio ROS  Rhythm:Regular Rate:Normal     Neuro/Psych negative neurological ROS  negative psych ROS   GI/Hepatic negative GI ROS, Neg liver ROS,   Endo/Other  Obesity  Renal/GU negative Renal ROS  negative genitourinary   Musculoskeletal negative musculoskeletal ROS (+)   Abdominal (+) + obese,   Peds  Hematology negative hematology ROS (+)   Anesthesia Other Findings   Reproductive/Obstetrics Menorrhagia HSV Endometrial polyp Abnormal Pap smear                            Anesthesia Physical Anesthesia Plan  ASA: II  Anesthesia Plan: General   Post-op Pain Management:    Induction: Intravenous  Airway Management Planned: LMA  Additional Equipment:   Intra-op Plan:   Post-operative Plan: Extubation in OR  Informed Consent: I have reviewed the patients History and Physical, chart, labs and discussed the procedure including the risks, benefits and alternatives for the proposed anesthesia with the patient or authorized representative who has indicated his/her understanding and acceptance.   Dental advisory given  Plan Discussed with: CRNA, Anesthesiologist and Surgeon  Anesthesia Plan Comments:         Anesthesia Quick Evaluation

## 2014-09-07 NOTE — Transfer of Care (Signed)
Immediate Anesthesia Transfer of Care Note  Patient: Ashley Wagner  Procedure(s) Performed: Procedure(s): DILATATION & CURETTAGE/HYSTEROSCOPY WITH TRUCLEAR (N/A)  Patient Location: PACU  Anesthesia Type:General  Level of Consciousness: awake, alert  and oriented  Airway & Oxygen Therapy: Patient Spontanous Breathing and Patient connected to nasal cannula oxygen  Post-op Assessment: Report given to PACU RN and Post -op Vital signs reviewed and stable  Post vital signs: Reviewed and stable  Complications: No apparent anesthesia complications

## 2014-09-07 NOTE — Op Note (Signed)
Pre op DX: Menorrhagia History of gonorrhea  Post Op UE:[facility]   PHYSICIAN : Gaberial Cada   ASSISTANTS: none   ANESTHESIA:   General LMA and paracervical block  ESTIMATED BLOOD LOSS: minimal  LOCAL MEDICATIONS USED:  LIDOCAINE 20CC  SPECIMEN:  Source of Specimen:  endometrial curettings and polyps  DISPOSITION OF SPECIMEN:  PATHOLOGY  COUNTS Correct:  YES    DICTATION #: The patient was taken to the operating room and prepped and draped in a normal sterile fashion. An in out catheter was used to drain the bladder.   A bivalve speculum was placed into the vagina and anterior lip of the cervix was grasped with a single-tooth tenaculum.  20 cc of 1% lidocaine was used for cervical block.  the cervix was then dilated with Kennon Rounds dilators up to 21. The hysteroscope was placed into the uterine cavity. The  entire uterus and both ostia were visualized. There were four small polyps seen in the lower uterine segment.  They were removed using Tru clear.    Hyseroscope was then removed from the uterus. A sharp curettage was then done with a curette and endometrial curettings were obtained. The endometrial curettings were sent to pathology. Again the hysteroscope was placed into the uterine cavity. Both ostia were again visualized. The tenaculum was removed from the cervix and hemostasis was noted.   PLAN OF CARE: discharge to home  PATIENT DISPOSITION:  PACU - hemodynamically stable.

## 2014-09-07 NOTE — Anesthesia Postprocedure Evaluation (Signed)
  Anesthesia Post-op Note  Patient: Ashley Wagner  Procedure(s) Performed: Procedure(s): DILATATION & CURETTAGE/HYSTEROSCOPY WITH TRUCLEAR (N/A)  Patient Location: PACU  Anesthesia Type:General  Level of Consciousness: awake, alert  and oriented  Airway and Oxygen Therapy: Patient Spontanous Breathing  Post-op Pain: none  Post-op Assessment: Post-op Vital signs reviewed, Patient's Cardiovascular Status Stable, Respiratory Function Stable, Patent Airway, No signs of Nausea or vomiting and Pain level controlled  Post-op Vital Signs: Reviewed and stable  Last Vitals:  Filed Vitals:   09/07/14 1134  BP:   Pulse: 87  Temp: 36.9 C  Resp: 20    Complications: No apparent anesthesia complications

## 2014-09-10 ENCOUNTER — Encounter (HOSPITAL_COMMUNITY): Payer: Self-pay | Admitting: Obstetrics and Gynecology

## 2014-09-10 LAB — CERVICOVAGINAL ANCILLARY ONLY
Chlamydia: NEGATIVE
NEISSERIA GONORRHEA: NEGATIVE

## 2015-06-07 DIAGNOSIS — R1011 Right upper quadrant pain: Secondary | ICD-10-CM | POA: Insufficient documentation

## 2015-06-07 DIAGNOSIS — R10811 Right upper quadrant abdominal tenderness: Secondary | ICD-10-CM | POA: Insufficient documentation

## 2015-06-10 DIAGNOSIS — K8020 Calculus of gallbladder without cholecystitis without obstruction: Secondary | ICD-10-CM | POA: Insufficient documentation

## 2015-07-30 ENCOUNTER — Other Ambulatory Visit: Payer: Self-pay | Admitting: Surgery

## 2015-07-30 HISTORY — PX: LAPAROSCOPIC CHOLECYSTECTOMY: SUR755

## 2015-07-30 HISTORY — PX: CHOLECYSTECTOMY: SHX55

## 2015-08-28 ENCOUNTER — Encounter: Payer: Self-pay | Admitting: Family Medicine

## 2015-08-28 DIAGNOSIS — I10 Essential (primary) hypertension: Secondary | ICD-10-CM | POA: Insufficient documentation

## 2015-08-30 ENCOUNTER — Encounter: Payer: Self-pay | Admitting: Family Medicine

## 2015-08-30 ENCOUNTER — Ambulatory Visit (INDEPENDENT_AMBULATORY_CARE_PROVIDER_SITE_OTHER): Payer: 59 | Admitting: Family Medicine

## 2015-08-30 VITALS — BP 122/74 | HR 76 | Temp 98.1°F | Resp 14 | Ht 69.0 in | Wt 213.0 lb

## 2015-08-30 DIAGNOSIS — Z0000 Encounter for general adult medical examination without abnormal findings: Secondary | ICD-10-CM

## 2015-08-30 DIAGNOSIS — K219 Gastro-esophageal reflux disease without esophagitis: Secondary | ICD-10-CM | POA: Diagnosis not present

## 2015-08-30 DIAGNOSIS — R5383 Other fatigue: Secondary | ICD-10-CM | POA: Diagnosis not present

## 2015-08-30 LAB — COMPLETE METABOLIC PANEL WITH GFR
ALT: 16 U/L (ref 6–29)
AST: 18 U/L (ref 10–30)
Albumin: 4.2 g/dL (ref 3.6–5.1)
Alkaline Phosphatase: 56 U/L (ref 33–115)
BILIRUBIN TOTAL: 0.3 mg/dL (ref 0.2–1.2)
BUN: 13 mg/dL (ref 7–25)
CALCIUM: 9.3 mg/dL (ref 8.6–10.2)
CO2: 24 mmol/L (ref 20–31)
Chloride: 104 mmol/L (ref 98–110)
Creat: 0.9 mg/dL (ref 0.50–1.10)
GFR, Est Non African American: 80 mL/min (ref >=60)
Glucose, Bld: 85 mg/dL (ref 70–99)
Potassium: 4.4 mmol/L (ref 3.5–5.3)
Sodium: 137 mmol/L (ref 135–146)
TOTAL PROTEIN: 7.4 g/dL (ref 6.1–8.1)

## 2015-08-30 LAB — LIPID PANEL
CHOLESTEROL: 161 mg/dL (ref 125–200)
HDL: 68 mg/dL (ref >=46)
LDL Cholesterol: 81 mg/dL (ref <130)
TRIGLYCERIDES: 61 mg/dL (ref <150)
Total CHOL/HDL Ratio: 2.4 Ratio (ref <=5.0)
VLDL: 12 mg/dL (ref <30)

## 2015-08-30 LAB — TSH: TSH: 1.336 u[IU]/mL (ref 0.350–4.500)

## 2015-08-30 MED ORDER — OMEPRAZOLE 40 MG PO CPDR: 40.0000 mg | DELAYED_RELEASE_CAPSULE | Freq: Every day | ORAL | Status: DC

## 2015-08-30 NOTE — Progress Notes (Signed)
Subjective:    Patient ID: Ashley Wagner, female    DOB: 1975-12-15, 40 y.o.   MRN: [id number]  HPI  Ashley Wagner is here today to establish care. She has a rash on both shins consistent with venous stasis dermatitis. She has a long-standing history of leg swelling. The rash is a hyperpigmented macular rash that coalesces and patches only on the dorsum of the shins. She also has bony nodules on the dorsum of the PIP joint on the right third and fourth finger consistent with Bouchard's nodes.  She also reports daily acid reflux. She is tried elevating the head of her bed. She is tried changing her diet. She denies any weight loss. She denies any black tarry stools. She sees a gynecologist who just performed for Pap smear and her mammogram both of which were normal. She does have an endometrial polyp and she is contemplating endometrial ablation due to heavy periods. Otherwise she is very healthy. Past Medical History  Diagnosis Date  . HSV-2 infection   . Chlamydia 2000    treated  . Abnormal Pap smear 2001    cin1 and cin2 colpo 05/2002  . Hypertension    Past Surgical History  Procedure Laterality Date  . Leep  2003  . Cesarean section  2004    x 1  . Tooth extraction    . Dilatation & curettage/hysteroscopy with trueclear N/A 09/07/2014    Procedure: DILATATION & CURETTAGE/HYSTEROSCOPY WITH TRUCLEAR;  Surgeon: Crawford Givens, MD;  Location: [facility];  Service: Gynecology;  Laterality: N/A;  . Cholecystectomy  07/30/15   Current Outpatient Prescriptions on File Prior to Visit  Medication Sig Dispense Refill  . fluticasone (FLONASE) 50 MCG/ACT nasal spray Place 2 sprays into both nostrils daily as needed for allergies or rhinitis.    . naproxen sodium (ANAPROX) 220 MG tablet Take 220 mg by mouth daily as needed (pain).    . valACYclovir (VALTREX) 500 MG tablet Take 500 mg by mouth daily as needed (outbreaks).      No current facility-administered medications on file prior to visit.   No Known  Allergies Social History   Social History  . Marital Status: Married    Spouse Name: N/A  . Number of Children: N/A  . Years of Education: N/A   Occupational History  . Not on file.   Social History Main Topics  . Smoking status: Former Smoker    Quit date: 08/27/2001  . Smokeless tobacco: Never Used  . Alcohol Use: 0.6 oz/week    1 Glasses of wine per week     Comment: social  . Drug Use: No  . Sexual Activity: Yes    Birth Control/ Protection: None   Other Topics Concern  . Not on file   Social History Narrative   Family History  Problem Relation Age of Onset  . Cancer Father     Colon  . Arthritis Father   . Hypertension Father   . Depression Mother   . Arthritis Sister   . Depression Brother   . Drug abuse Brother   . Hypertension Brother       Review of Systems     Objective:   Physical Exam  Constitutional: She is oriented to person, place, and time. She appears well-developed and well-nourished. No distress.  HENT:  Head: Normocephalic and atraumatic.  Right Ear: External ear normal.  Left Ear: External ear normal.  Nose: Nose normal.  Mouth/Throat: Oropharynx is clear and moist. No oropharyngeal  exudate.  Eyes: Conjunctivae and EOM are normal. Pupils are equal, round, and reactive to light. Right eye exhibits no discharge. Left eye exhibits no discharge. No scleral icterus.  Neck: Normal range of motion. Neck supple. No JVD present. No tracheal deviation present. No thyromegaly present.  Cardiovascular: Normal rate, regular rhythm and normal heart sounds.  Exam reveals no gallop and no friction rub.   No murmur heard. Pulmonary/Chest: Effort normal and breath sounds normal. No stridor. No respiratory distress. She has no wheezes. She has no rales. She exhibits no tenderness.  Abdominal: Soft. Bowel sounds are normal. She exhibits no distension and no mass. There is no tenderness. There is no rebound and no guarding.  Musculoskeletal: Normal range  of motion. She exhibits edema. She exhibits no tenderness.  Lymphadenopathy:    She has no cervical adenopathy.  Neurological: She is alert and oriented to person, place, and time. She has normal reflexes. She displays normal reflexes. No cranial nerve deficit. She exhibits normal muscle tone. Coordination normal.  Skin: Rash noted. She is not diaphoretic.  Psychiatric: She has a normal mood and affect. Her behavior is normal. Judgment and thought content normal.  Vitals reviewed.         Assessment & Plan:  Gastroesophageal reflux disease without esophagitis - Plan: omeprazole (PRILOSEC) 40 MG capsule  Other fatigue - Plan: TSH  Routine general medical examination at a health care facility - Plan: COMPLETE METABOLIC PANEL WITH GFR, Lipid panel  physical exam is otherwise normal. I did recommend diet exercise and weight loss. I will start the patient on omeprazole 40 mg by mouth daily for GERD. Recheck if no better after 2-3 weeks. Patient received her flu shot today. Her cancer screening is up-to-date. Her gynecologist. I will check a CMP, fasting lipid panel. I will also check a TSH because the patient does report some mild fatigue. Her CBC has recently been checked her gynecologist.

## 2015-09-02 ENCOUNTER — Encounter: Payer: Self-pay | Admitting: Family Medicine

## 2016-01-14 ENCOUNTER — Telehealth: Payer: Self-pay | Admitting: Family Medicine

## 2016-01-14 ENCOUNTER — Ambulatory Visit (INDEPENDENT_AMBULATORY_CARE_PROVIDER_SITE_OTHER): Payer: 59 | Admitting: Family Medicine

## 2016-01-14 ENCOUNTER — Ambulatory Visit
Admission: RE | Admit: 2016-01-14 | Discharge: 2016-01-14 | Disposition: A | Discharge status: Home / Self Care | Payer: 59 | Source: Ambulatory Visit | Attending: Family Medicine | Admitting: Family Medicine

## 2016-01-14 ENCOUNTER — Encounter: Payer: Self-pay | Admitting: Family Medicine

## 2016-01-14 VITALS — BP 118/78 | HR 84 | Temp 98.1°F | Resp 18 | Wt 223.0 lb

## 2016-01-14 DIAGNOSIS — M7989 Other specified soft tissue disorders: Secondary | ICD-10-CM

## 2016-01-14 DIAGNOSIS — R609 Edema, unspecified: Secondary | ICD-10-CM

## 2016-01-14 MED ORDER — FUROSEMIDE 20 MG PO TABS: 20.0000 mg | ORAL_TABLET | Freq: Every day | ORAL | Status: DC

## 2016-01-14 NOTE — Patient Instructions (Signed)
Get ultrasound done We will call with results F/U pending results

## 2016-01-14 NOTE — Progress Notes (Signed)
Patient ID: Ashley L Desai, female   DOB: [birth year], 40 y.o.   MRN: [id number]   Subjective:    Patient ID: Ashley Wagner, female    DOB: 1976/01/14, 40 y.o.   MRN: [id number]  Patient presents for c/o pain rt leg x 1 week Before meals here with increased swelling and pain in the right leg compared to the left. She's history of what sounds like chronic venous insufficiency/edema in the past she was on diuretics. She recently started wearing her compression hose again. She does exercise typically twice a week but nothing strenuous. She felt like she had a major charley horse in her calf. No SOB, no chest pain  She was started on estrogen and progesterone birth control pill about 2 months ago   Review Of Systems:  GEN- denies fatigue, fever, weight loss,weakness, recent illness HEENT- denies eye drainage, change in vision, nasal discharge, CVS- denies chest pain, palpitations RESP- denies SOB, cough, wheeze ABD- denies N/V, change in stools, abd pain GU- denies dysuria, hematuria, dribbling, incontinence MSK- denies joint pain, muscle aches, injury Neuro- denies headache, dizziness, syncope, seizure activity       Objective:    BP 118/78 mmHg  Pulse 84  Temp(Src) 98.1 F (36.7 C) (Oral)  Resp 18  Wt 223 lb (101.152 kg) GEN- NAD, alert and oriented x3 HEENT- PERRL, EOMI, non injected sclera, pink conjunctiva, MMM, oropharynx clear Neck- Supple, no KVD  CVS- RRR, no murmur RESP-CTAB Ext- bilat edema R>l non Pitting, hyperpigmented macules bilat LE, calf mild TTP, neg homans, Right 16 1/4, left 15 3/4  Pulses- Radial, DP- 2+        Assessment & Plan:      Problem List Items Addressed This Visit    Peripheral edema   Relevant Orders   US Venous Img Lower Unilateral Right    Other Visit Diagnoses    Calf swelling    -  Primary    Chronic swelling, but with recent OCP start, enlarged calf and pain, will obtain US, if neg, plan to start lasix 20mg     Relevant Orders    US Venous Img Lower Unilateral Right       Note: This dictation was prepared with Dragon dictation along with smaller phrase technology. Any transcriptional errors that result from this process are unintentional.

## 2016-01-14 NOTE — Telephone Encounter (Signed)
-----   Message from Alycia Rossetti, MD sent at 01/14/2016  4:07 PM EDT ----- Call pt no DVT, we will start lasix 20mg  once a day, recheck in 1 week

## 2016-01-14 NOTE — Telephone Encounter (Signed)
Pt aware NO DVT  Rx for lasix to pharmacy and 1 week appt made

## 2016-01-21 ENCOUNTER — Ambulatory Visit
Admission: RE | Admit: 2016-01-21 | Discharge: 2016-01-21 | Disposition: A | Discharge status: Home / Self Care | Payer: 59 | Source: Ambulatory Visit | Attending: Family Medicine | Admitting: Family Medicine

## 2016-01-21 ENCOUNTER — Ambulatory Visit (INDEPENDENT_AMBULATORY_CARE_PROVIDER_SITE_OTHER): Payer: 59 | Admitting: Family Medicine

## 2016-01-21 ENCOUNTER — Encounter: Payer: Self-pay | Admitting: Family Medicine

## 2016-01-21 VITALS — BP 136/70 | HR 68 | Temp 98.4°F | Resp 12 | Ht 69.0 in | Wt 224.0 lb

## 2016-01-21 DIAGNOSIS — M25561 Pain in right knee: Secondary | ICD-10-CM | POA: Diagnosis not present

## 2016-01-21 DIAGNOSIS — M1990 Unspecified osteoarthritis, unspecified site: Secondary | ICD-10-CM

## 2016-01-21 DIAGNOSIS — M19042 Primary osteoarthritis, left hand: Secondary | ICD-10-CM

## 2016-01-21 DIAGNOSIS — R609 Edema, unspecified: Secondary | ICD-10-CM

## 2016-01-21 DIAGNOSIS — M19041 Primary osteoarthritis, right hand: Secondary | ICD-10-CM

## 2016-01-21 LAB — SEDIMENTATION RATE: Sed Rate: 5 mm/hr (ref 0–20)

## 2016-01-21 LAB — RHEUMATOID FACTOR: Rhuematoid fact SerPl-aCnc: <10 IU/mL (ref <=14)

## 2016-01-21 NOTE — Patient Instructions (Signed)
Try tylenol or biofreeze or asprecreme  Get xrays done  We will call with lab results  F/u PENDING RESULTS

## 2016-01-22 ENCOUNTER — Encounter: Payer: Self-pay | Admitting: Family Medicine

## 2016-01-22 NOTE — Progress Notes (Signed)
Patient ID: Ashley Wagner, female   DOB: 09/19/1975, 40 y.o.   MRN: [id number]    Subjective:    Patient ID: Ashley Wagner, female    DOB: [birth year], 40 y.o.   MRN: [id number]  Patient presents for 1 week F/U Patient here for one-week follow-up on her lower extremity edema. She has history of chronic venous insufficiency. Started on Lasix 20 mg once a day she did well with this states that she initially urinated a lot now that has settled down but her leg swelling has gone down. She still gets it late in the evening that she does wear her compression hose as needed. She is also now working out and noticed some right knee pain occasionally she feels a popping. She thinks that fluid on it initially but that is gone down. She will take occasionally taken ibuprofen. She also like her hands look that she does have family history of arthritis that she is not sure if it is rheumatoid she has not feels on her second and third digits on both hands was told that it was likely arthritis. She does get stiffness in her hands.    Review Of Systems:  GEN- denies fatigue, fever, weight loss,weakness, recent illness HEENT- denies eye drainage, change in vision, nasal discharge, CVS- denies chest pain, palpitations RESP- denies SOB, cough, wheeze ABD- denies N/V, change in stools, abd pain GU- denies dysuria, hematuria, dribbling, incontinence MSK- +joint pain, muscle aches, injury Neuro- denies headache, dizziness, syncope, seizure activity       Objective:    BP 136/70 mmHg  Pulse 68  Temp(Src) 98.4 F (36.9 C) (Oral)  Resp 12  Ht 5\' 9"  (1.753 m)  Wt 224 lb (101.606 kg)  BMI 33.06 kg/m2  LMP 01/16/2016 (Approximate) GEN- NAD, alert and oriented x3 MSK- Right knee- normal inspection, good ROM, no crepitus, ligmaents in tact Bilat hands small subcutaenous nodules on 2nd and 3rd digits near MIP, normal grasp, able to make fist  Ext- Trace bilat ankle edema non Pitting, hyperpigmented macules  bilat LE, varicose veins noted Pulses- Radial 2+      Assessment & Plan:      Problem List Items Addressed This Visit    Peripheral edema - Primary    Much improved, continue lasix daily       Other Visit Diagnoses    Arthritis of both hands        Relevant Orders    Sedimentation Rate (Completed)    Rheumatoid factor (Completed)    DG Hand Complete Right (Completed)    DG Hand Complete Left (Completed)    Right knee pain        R/O OA in knees, I think okay to exercise, Xray to be done on knee and hands. Advised topical, too many NSAIDS may exacerbate her swelling     Relevant Orders    DG Knee Complete 4 Views Right (Completed)       Note: This dictation was prepared with Dragon dictation along with smaller phrase technology. Any transcriptional errors that result from this process are unintentional.

## 2016-01-22 NOTE — Assessment & Plan Note (Signed)
Much improved, continue lasix daily

## 2016-02-10 ENCOUNTER — Other Ambulatory Visit: Payer: Self-pay | Admitting: Family Medicine

## 2016-02-11 NOTE — Telephone Encounter (Signed)
Refill appropriate and filled per protocol. 

## 2016-03-10 ENCOUNTER — Ambulatory Visit (INDEPENDENT_AMBULATORY_CARE_PROVIDER_SITE_OTHER): Payer: 59 | Admitting: Family Medicine

## 2016-03-10 ENCOUNTER — Encounter: Payer: Self-pay | Admitting: Family Medicine

## 2016-03-10 VITALS — BP 128/78 | Temp 98.0°F

## 2016-03-10 DIAGNOSIS — R0781 Pleurodynia: Secondary | ICD-10-CM | POA: Diagnosis not present

## 2016-03-10 NOTE — Progress Notes (Signed)
Subjective:    Patient ID: Ashley Wagner, female    DOB: 1976-01-19, 40 y.o.   MRN: [id number]  HPI Symptoms began approximately 1 week ago. The patient denies any knowledge of any inciting event. However she has pain in the posterior aspect of her right flank along the body of the seventh rib. The pain radiates from that position around to the front of her abdomen along the body of the rib. The rib is exquisitely tender to palpation. She also has some mild pleurisy. She denies any cough. She denies any shortness of breath. She denies any hemoptysis. She denies any abdominal pain. She denies any nausea or vomiting or hematemesis or black tarry stools. Her periods are regular and she is due to start her next period today. She denies any hematuria or dysuria. Past Medical History  Diagnosis Date  . HSV-2 infection   . Chlamydia 2000    treated  . Abnormal Pap smear 2001    cin1 and cin2 colpo 05/2002  . Hypertension    Past Surgical History  Procedure Laterality Date  . Leep  2003  . Cesarean section  2004    x 1  . Tooth extraction    . Dilatation & curettage/hysteroscopy with trueclear N/A 09/07/2014    Procedure: DILATATION & CURETTAGE/HYSTEROSCOPY WITH TRUCLEAR;  Surgeon: Crawford Givens, MD;  Location: [facility];  Service: Gynecology;  Laterality: N/A;  . Cholecystectomy  07/30/15   Current Outpatient Prescriptions on File Prior to Visit  Medication Sig Dispense Refill  . fluticasone (FLONASE) 50 MCG/ACT nasal spray Place 2 sprays into both nostrils daily as needed for allergies or rhinitis.    . furosemide (LASIX) 20 MG tablet TAKE 1 TABLET (20 MG TOTAL) BY MOUTH DAILY. 30 tablet 0  . MIBELAS 24 FE 1-20 MG-MCG(24) CHEW CHEW 1 TABLET BY MOUTH DAILY  6  . naproxen sodium (ANAPROX) 220 MG tablet Take 220 mg by mouth daily as needed (pain).    . valACYclovir (VALTREX) 500 MG tablet Take 500 mg by mouth daily as needed (outbreaks).     Marland Kitchen omeprazole (PRILOSEC) 40 MG capsule Take 1 capsule  (40 mg total) by mouth daily. (Patient not taking: Reported on 03/10/2016) 30 capsule 3   No current facility-administered medications on file prior to visit.   No Known Allergies Social History   Social History  . Marital Status: Married    Spouse Name: N/A  . Number of Children: N/A  . Years of Education: N/A   Occupational History  . Not on file.   Social History Main Topics  . Smoking status: Former Smoker    Quit date: 08/27/2001  . Smokeless tobacco: Never Used  . Alcohol Use: 0.6 oz/week    1 Glasses of wine per week     Comment: social  . Drug Use: No  . Sexual Activity: Yes    Birth Control/ Protection: None   Other Topics Concern  . Not on file   Social History Narrative      Review of Systems  All other systems reviewed and are negative.      Objective:   Physical Exam  Cardiovascular: Normal rate, regular rhythm and normal heart sounds.   Pulmonary/Chest: Effort normal and breath sounds normal. No respiratory distress. She has no wheezes. She has no rales. She exhibits tenderness.  Abdominal: Soft. Bowel sounds are normal. She exhibits no distension. There is no tenderness. There is no rebound.  Vitals reviewed.  Assessment & Plan:  Rib pain on right side  Symptoms are consistent with a bruised or cracked rib or possibly an intercostal muscle strain. I recommended one-week of clinical monitoring. Use ibuprofen 800 mg every 8 hours for pain. If symptoms persist in one week, proceed with imaging of the chest to rule out underlying pathology. If symptoms change prior to that she is to return immediately for reassessment

## 2016-03-13 ENCOUNTER — Ambulatory Visit (INDEPENDENT_AMBULATORY_CARE_PROVIDER_SITE_OTHER): Payer: 59 | Admitting: Family Medicine

## 2016-03-13 ENCOUNTER — Encounter: Payer: Self-pay | Admitting: Family Medicine

## 2016-03-13 VITALS — BP 158/94 | HR 86 | Temp 98.2°F | Resp 16 | Wt 220.0 lb

## 2016-03-13 DIAGNOSIS — R1011 Right upper quadrant pain: Secondary | ICD-10-CM | POA: Diagnosis not present

## 2016-03-13 DIAGNOSIS — R110 Nausea: Secondary | ICD-10-CM | POA: Diagnosis not present

## 2016-03-13 LAB — COMPLETE METABOLIC PANEL WITH GFR
ALK PHOS: 51 U/L (ref 33–115)
ALT: 14 U/L (ref 6–29)
AST: 16 U/L (ref 10–30)
Albumin: 3.9 g/dL (ref 3.6–5.1)
BUN: 9 mg/dL (ref 7–25)
CALCIUM: 8.9 mg/dL (ref 8.6–10.2)
CO2: 27 mmol/L (ref 20–31)
Chloride: 103 mmol/L (ref 98–110)
Creat: 1.01 mg/dL (ref 0.50–1.10)
GFR, EST AFRICAN AMERICAN: 80 mL/min (ref >=60)
GFR, EST NON AFRICAN AMERICAN: 70 mL/min (ref >=60)
GLUCOSE: 94 mg/dL (ref 70–99)
Potassium: 4.5 mmol/L (ref 3.5–5.3)
Sodium: 139 mmol/L (ref 135–146)
TOTAL PROTEIN: 7 g/dL (ref 6.1–8.1)
Total Bilirubin: 0.3 mg/dL (ref 0.2–1.2)

## 2016-03-13 LAB — URINALYSIS, MICROSCOPIC ONLY
CASTS: NONE SEEN [LPF]
Crystals: NONE SEEN [HPF]
YEAST: NONE SEEN [HPF]

## 2016-03-13 LAB — CBC WITH DIFFERENTIAL/PLATELET
BASOS PCT: 0 %
Basophils Absolute: 0 cells/uL (ref 0–200)
EOS PCT: 5 %
Eosinophils Absolute: 310 cells/uL (ref 15–500)
HCT: 40.4 % (ref 35.0–45.0)
Hemoglobin: 13 g/dL (ref 12.0–15.0)
LYMPHS PCT: 32 %
Lymphs Abs: 1984 cells/uL (ref 850–3900)
MCH: 29.8 pg (ref 27.0–33.0)
MCHC: 32.2 g/dL (ref 32.0–36.0)
MCV: 92.7 fL (ref 80.0–100.0)
MONO ABS: 310 {cells}/uL (ref 200–950)
MONOS PCT: 5 %
MPV: 10 fL (ref 7.5–12.5)
NEUTROS PCT: 58 %
Neutro Abs: 3596 cells/uL (ref 1500–7800)
PLATELETS: 328 10*3/uL (ref 140–400)
RBC: 4.36 MIL/uL (ref 3.80–5.10)
RDW: 14.7 % (ref 11.0–15.0)
WBC: 6.2 10*3/uL (ref 3.8–10.8)

## 2016-03-13 LAB — URINALYSIS, ROUTINE W REFLEX MICROSCOPIC
Bilirubin Urine: NEGATIVE
GLUCOSE, UA: NEGATIVE
Ketones, ur: NEGATIVE
NITRITE: NEGATIVE
Protein, ur: NEGATIVE
Specific Gravity, Urine: <1.005 (ref 1.001–1.035)
pH: 5.5 (ref 5.0–8.0)

## 2016-03-13 LAB — LIPASE: Lipase: 15 U/L (ref 7–60)

## 2016-03-13 NOTE — Progress Notes (Signed)
Subjective:    Patient ID: Ashley Wagner, female    DOB: [birth year], 40 y.o.   MRN: [id number]  HPI 03/10/16 Symptoms began approximately 1 week ago. The patient denies any knowledge of any inciting event. However she has pain in the posterior aspect of her right flank along the body of the seventh rib. The pain radiates from that position around to the front of her abdomen along the body of the rib. The rib is exquisitely tender to palpation. She also has some mild pleurisy. She denies any cough. She denies any shortness of breath. She denies any hemoptysis. She denies any abdominal pain. She denies any nausea or vomiting or hematemesis or black tarry stools. Her periods are regular and she is due to start her next period today. She denies any hematuria or dysuria.  At that time, my plan was: Symptoms are consistent with a bruised or cracked rib or possibly an intercostal muscle strain. I recommended one-week of clinical monitoring. Use ibuprofen 800 mg every 8 hours for pain. If symptoms persist in one week, proceed with imaging of the chest to rule out underlying pathology. If symptoms change prior to that she is to return immediately for reassessment  03/13/16 The patient's symptoms are worsening. She is now having pain radiating from her right upper quadrant all the way into her right back similar to pain she had when she had her gallbladder removed. She is also having pain radiated into her right shoulder blade and into her right lower quadrant. The pain is constant. It never goes away. It is now no longer brought on by palpation but is rather constant. She is also developing nausea. At times the pain intensifies. She does not have any tenderness in the right lower quadrant near the appendix but she is tender to palpation in the right upper quadrant underneath the ribs. There is no guarding there is no rebound. She denies any black tarry stools or blood in her stools. She denies any hematemesis. She  denies any vomiting. She denies any constipation. There is no evidence of jaundice or scleral icterus. She denies fever. Past Medical History  Diagnosis Date  . HSV-2 infection   . Chlamydia 2000    treated  . Abnormal Pap smear 2001    cin1 and cin2 colpo 05/2002  . Hypertension    Past Surgical History  Procedure Laterality Date  . Leep  2003  . Cesarean section  2004    x 1  . Tooth extraction    . Dilatation & curettage/hysteroscopy with trueclear N/A 09/07/2014    Procedure: DILATATION & CURETTAGE/HYSTEROSCOPY WITH TRUCLEAR;  Surgeon: Crawford Givens, MD;  Location: [facility];  Service: Gynecology;  Laterality: N/A;  . Cholecystectomy  07/30/15   Current Outpatient Prescriptions on File Prior to Visit  Medication Sig Dispense Refill  . fluticasone (FLONASE) 50 MCG/ACT nasal spray Place 2 sprays into both nostrils daily as needed for allergies or rhinitis.    . furosemide (LASIX) 20 MG tablet TAKE 1 TABLET (20 MG TOTAL) BY MOUTH DAILY. 30 tablet 0  . MIBELAS 24 FE 1-20 MG-MCG(24) CHEW CHEW 1 TABLET BY MOUTH DAILY  6  . naproxen sodium (ANAPROX) 220 MG tablet Take 220 mg by mouth daily as needed (pain).    Marland Kitchen omeprazole (PRILOSEC) 40 MG capsule Take 1 capsule (40 mg total) by mouth daily. (Patient not taking: Reported on 03/10/2016) 30 capsule 3  . valACYclovir (VALTREX) 500 MG tablet Take 500 mg by mouth  daily as needed (outbreaks).      No current facility-administered medications on file prior to visit.   No Known Allergies Social History   Social History  . Marital Status: Married    Spouse Name: N/A  . Number of Children: N/A  . Years of Education: N/A   Occupational History  . Not on file.   Social History Main Topics  . Smoking status: Former Smoker    Quit date: 08/27/2001  . Smokeless tobacco: Never Used  . Alcohol Use: 0.6 oz/week    1 Glasses of wine per week     Comment: social  . Drug Use: No  . Sexual Activity: Yes    Birth Control/ Protection: None    Other Topics Concern  . Not on file   Social History Narrative      Review of Systems  All other systems reviewed and are negative.      Objective:   Physical Exam  Constitutional: She appears well-developed and well-nourished.  Eyes: No scleral icterus.  Cardiovascular: Normal rate, regular rhythm and normal heart sounds.   Pulmonary/Chest: Effort normal and breath sounds normal. No respiratory distress. She has no wheezes. She has no rales. She exhibits tenderness.  Abdominal: Soft. Bowel sounds are normal. She exhibits no distension and no mass. There is tenderness. There is no rebound and no guarding.  Vitals reviewed.         Assessment & Plan:  RUQ abdominal pain - Plan: CBC with Differential/Platelet, COMPLETE METABOLIC PANEL WITH GFR, Urinalysis, Routine w reflex microscopic (not at [facility]), Lipase, CT Abdomen Pelvis W Contrast  Nausea without vomiting  Patient is obviously anxious. This is no longer sounding musculoskeletal in nature. It is possible that the patient may have intermittent common bile duct obstruction due to retained stones. Another possibility would be adhesions in this area causing pain. Obtain a CBC to evaluate for leukocytosis. Obtain a urinalysis to evaluate for hematuria although I do not believe that these are kidney stones as the pain radiates from the right flank into the right upper quadrant not into the right lower quadrant. Obtain a CMP to evaluate for elevated liver function test. Obtain a lipase to evaluate for atypical pancreatitis. I will schedule the patient for a CT scan of the abdomen and pelvis as soon as possible. Further treatment decisions pending workup. At the present time the patient appears clinically stable to proceed with outpatient workup

## 2016-03-18 ENCOUNTER — Ambulatory Visit
Admission: RE | Admit: 2016-03-18 | Discharge: 2016-03-18 | Disposition: A | Discharge status: Home / Self Care | Payer: 59 | Source: Ambulatory Visit | Attending: Family Medicine | Admitting: Family Medicine

## 2016-03-18 DIAGNOSIS — R1011 Right upper quadrant pain: Secondary | ICD-10-CM

## 2016-03-18 MED ORDER — IOPAMIDOL (ISOVUE-300) INJECTION 61%
100.0000 mL | Freq: Once | INTRAVENOUS | Status: AC | PRN
  Administered 2016-03-18: 100 mL via INTRAVENOUS

## 2016-03-30 ENCOUNTER — Encounter: Payer: Self-pay | Admitting: Internal Medicine

## 2016-03-30 ENCOUNTER — Other Ambulatory Visit: Payer: Self-pay | Admitting: Family Medicine

## 2016-03-30 DIAGNOSIS — R1011 Right upper quadrant pain: Secondary | ICD-10-CM

## 2016-03-30 DIAGNOSIS — R110 Nausea: Secondary | ICD-10-CM

## 2016-06-03 ENCOUNTER — Encounter (INDEPENDENT_AMBULATORY_CARE_PROVIDER_SITE_OTHER): Payer: Self-pay

## 2016-06-03 ENCOUNTER — Ambulatory Visit (INDEPENDENT_AMBULATORY_CARE_PROVIDER_SITE_OTHER): Payer: 59 | Admitting: Internal Medicine

## 2016-06-03 ENCOUNTER — Encounter: Payer: Self-pay | Admitting: Internal Medicine

## 2016-06-03 VITALS — BP 120/70 | HR 76 | Ht 69.0 in | Wt 219.0 lb

## 2016-06-03 DIAGNOSIS — R1011 Right upper quadrant pain: Secondary | ICD-10-CM | POA: Diagnosis not present

## 2016-06-03 DIAGNOSIS — Z800 Family history of malignant neoplasm of digestive organs: Secondary | ICD-10-CM

## 2016-06-03 DIAGNOSIS — K5909 Other constipation: Secondary | ICD-10-CM | POA: Diagnosis not present

## 2016-06-03 DIAGNOSIS — K219 Gastro-esophageal reflux disease without esophagitis: Secondary | ICD-10-CM | POA: Diagnosis not present

## 2016-06-03 MED ORDER — NA SULFATE-K SULFATE-MG SULF 17.5-3.13-1.6 GM/177ML PO SOLN: 1.0000 | Freq: Once | ORAL | 0 refills | Status: AC

## 2016-06-03 NOTE — Patient Instructions (Signed)
You have been scheduled for an endoscopy and colonoscopy. Please follow the written instructions given to you at your visit today. Please pick up your prep supplies at the pharmacy within the next 1-3 days. If you use inhalers (even only as needed), please bring them with you on the day of your procedure. Your physician has requested that you go to www.startemmi.com and enter the access code given to you at your visit today. This web site gives a general overview about your procedure. However, you should still follow specific instructions given to you by our office regarding your preparation for the procedure.   Use Miralax daily

## 2016-06-03 NOTE — Progress Notes (Signed)
HISTORY OF PRESENT ILLNESS:  Ashley Wagner is a 40 y.o. female who is referred by her primary care provider Dr. Dennard Schaumann with chief complaint of right upper quadrant pain. Outside records were requested and reviewed. Patient is accompanied by her husband. The patient reports that her Ashley Wagner began approximately 1 year ago when she had a severe episode of right upper quadrant pain. He was found to have gallstones. She subsequently underwent laparoscopic cholecystectomy with Dr. Brantley Stage 07/30/2015 at the outpatient surgical center. I have reviewed that report. She was found to have mild chronic cholecystitis with cholelithiasis on pathology. Intraoperative cholangiogram was negative. Patient states that she did well until July when she had recurrence of the same pain as one year previous. She was evaluated by her PCP who noticed severe rib pain. Since that time the patient has had near daily pain to varying degrees. She feels that symptoms may be worse with greasy foods and less with salads. May remain not be affected by exercise that she decrease her exercise after recurrence of pain. She does feel that the discomfort is somewhat improved with a good bowel movement and that her bowel habits have tended to be a bit on the stomach side. No bleeding. She does have occasional nausea without vomiting. She also has intermittent reflux symptoms without dysphagia for which she takes on demand PPI. Finally occasional NSAIDs. Patient has not had prior GI evaluations. Her father had colon cancer proximally age 82. At the time of her most recent evaluation for severe pain, in July, laboratories were obtained. Comprehensive metabolic panel including liver tests were normal. CBC with differential was normal. Previous sedimentation rate and thyroid studies were unremarkable. Her weight has been stable. She was sent for CT scan of the abdomen and pelvis with contrast 03/18/2016. There were no acute findings or findings to  explain pain. She is now referred.  REVIEW OF SYSTEMS:  All non-GI ROS negative upon review of all systems non-GI  Past Medical History:  Diagnosis Date  . Abnormal Pap smear 2001   cin1 and cin2 colpo 05/2002  . Chlamydia 2000   treated  . HSV-2 infection   . Hypertension     Past Surgical History:  Procedure Laterality Date  . CESAREAN SECTION  2004   x 1  . CHOLECYSTECTOMY  07/30/15  . DILATATION & CURETTAGE/HYSTEROSCOPY WITH TRUECLEAR N/A 09/07/2014   Procedure: DILATATION & CURETTAGE/HYSTEROSCOPY WITH TRUCLEAR;  Surgeon: Crawford Givens, MD;  Location: [facility];  Service: Gynecology;  Laterality: N/A;  . LEEP  2003  . TOOTH EXTRACTION      Social History Ashley Wagner  reports that she quit smoking about 14 years ago. Her smoking use included Cigarettes. She has never used smokeless tobacco. She reports that she drinks about 0.6 oz of alcohol per week . She reports that she does not use drugs.  family history includes Arthritis in her father and sister; Cancer (age of onset: 88) in her father; Colon cancer in her paternal grandmother; Colon polyps in her father; Depression in her brother and mother; Drug abuse in her brother; Hypertension in her brother and father.  No Known Allergies     PHYSICAL EXAMINATION: Vital signs: BP 120/70 (BP Location: Left Arm, Patient Position: Sitting, Cuff Size: Normal)   Pulse 76   Ht 5\' 9"  (1.753 m)   Wt 219 lb (99.3 kg)   BMI 32.34 kg/m   Constitutional:Pleasant, obese, generally well-appearing, no acute distress Psychiatric: alert and oriented x3, cooperative  Eyes: extraocular movements intact, anicteric, conjunctiva pink Mouth: oral pharynx moist, no lesions Neck: supple no lymphadenopathy Cardiovascular: heart regular rate and rhythm, no murmur Lungs: clear to auscultation bilaterally Abdomen: soft, obese, nontender, nondistended, no obvious ascites, no peritoneal signs, normal bowel sounds, no organomegaly. Exquisite tenderness  along the right lower rib cage which reproduces pain Rectal:For until colonoscopy Extremities: no clubbing, cyanosis, or lower extremity edema bilaterally Skin: no lesions on visible extremities Neuro: No focal deficits. Cranial nerves intact  ASSESSMENT:  #1. Chronic right-sided pain consistent with musculoskeletal, rib, pain . Some potential GI effects on her pain by history such as meals and bowel movements as reported-rule out GI problems #2. Chronic GERD #3. Constipation #4. Status post cholecystectomy with recurrence of similar pain thereafter #5. Family history of colon cancer in father #6. Obesity   PLAN:  #1. Reflux precautions #2. Continue PPI on demand. Lowest dose at lowest frequency to control symptoms #3. Recommended MiraLAX for irregular bowels. Discussed titration strategy #4. Reviewed postcholecystectomy pain with the patient and her husband #5. Upper endoscopy to evaluate chronic GERD and pain in patient with intermittent NSAID use.The nature of the procedure, as well as the risks, benefits, and alternatives were carefully and thoroughly reviewed with the patient. Ample time for discussion and questions allowed. The patient understood, was satisfied, and agreed to proceed. #6. Colonoscopy given family history of colon cancer in first-degree relative.The nature of the procedure, as well as the risks, benefits, and alternatives were carefully and thoroughly reviewed with the patient. Ample time for discussion and questions allowed. The patient understood, was satisfied, and agreed to proceed. #7. Exercise and weight loss #8. Ongoing general medical care with Dr. Dennard Schaumann. Dr. Dennard Schaumann to manage musculoskeletal pain if above GI workup does not reveal other processes to explain pain A copy of this consultation note has been sent to Dr. Margaretmary Eddy

## 2016-06-16 ENCOUNTER — Encounter: Payer: Self-pay | Admitting: Internal Medicine

## 2016-06-29 ENCOUNTER — Encounter: Payer: Self-pay | Admitting: Internal Medicine

## 2016-06-29 ENCOUNTER — Ambulatory Visit (AMBULATORY_SURGERY_CENTER): Payer: 59 | Admitting: Internal Medicine

## 2016-06-29 VITALS — BP 141/85 | HR 70 | Temp 99.1°F | Resp 16 | Ht 69.0 in | Wt 219.0 lb

## 2016-06-29 DIAGNOSIS — Z800 Family history of malignant neoplasm of digestive organs: Secondary | ICD-10-CM

## 2016-06-29 DIAGNOSIS — R1011 Right upper quadrant pain: Secondary | ICD-10-CM

## 2016-06-29 DIAGNOSIS — K219 Gastro-esophageal reflux disease without esophagitis: Secondary | ICD-10-CM

## 2016-06-29 DIAGNOSIS — Z1211 Encounter for screening for malignant neoplasm of colon: Secondary | ICD-10-CM

## 2016-06-29 DIAGNOSIS — G8929 Other chronic pain: Secondary | ICD-10-CM | POA: Diagnosis not present

## 2016-06-29 DIAGNOSIS — D120 Benign neoplasm of cecum: Secondary | ICD-10-CM

## 2016-06-29 DIAGNOSIS — D122 Benign neoplasm of ascending colon: Secondary | ICD-10-CM

## 2016-06-29 MED ORDER — SODIUM CHLORIDE 0.9 % IV SOLN: 500.0000 mL | INTRAVENOUS | Status: DC

## 2016-06-29 NOTE — Progress Notes (Signed)
Report given to PACU RN, vss 

## 2016-06-29 NOTE — Op Note (Signed)
[place] [facility] Patient Name: Ashley Wagner Procedure Date: 06/29/2016 3:14 PM MRN: [id number] Endoscopist: Docia Chuck. Henrene Pastor , MD Age: 40 Referring MD:  Date of Birth: 06-27-1976 Gender: Female Account #: [id number] Procedure:                Colonoscopy, with cold snare polypectomy X2 Indications:              Screening in patient at increased risk: Family                            history of 1st-degree relative with colorectal                            cancer. Father at 14 Medicines:                Monitored Anesthesia Care Procedure:                Pre-Anesthesia Assessment:                           - Prior to the procedure, a History and Physical                            was performed, and patient medications and                            allergies were reviewed. The patient's tolerance of                            previous anesthesia was also reviewed. The risks                            and benefits of the procedure and the sedation                            options and risks were discussed with the patient.                            All questions were answered, and informed consent                            was obtained. Prior Anticoagulants: The patient has                            taken no previous anticoagulant or antiplatelet                            agents. ASA Grade Assessment: II - A patient with                            mild systemic disease. After reviewing the risks                            and benefits, the patient was deemed in  satisfactory condition to undergo the procedure.                           After obtaining informed consent, the colonoscope                            was passed under direct vision. Throughout the                            procedure, the patient's blood pressure, pulse, and                            oxygen saturations were monitored continuously. The                            Model CF-HQ190L  [id number]) scope was introduced                            through the anus and advanced to the the cecum,                            identified by appendiceal orifice and ileocecal                            valve. The ileocecal valve, appendiceal orifice,                            and rectum were photographed. The quality of the                            bowel preparation was excellent. The colonoscopy                            was performed without difficulty. The patient                            tolerated the procedure well. The bowel preparation                            used was SUPREP. Scope In: 3:18:29 PM Scope Out: 3:32:31 PM Scope Withdrawal Time: 0 hours 10 minutes 24 seconds  Total Procedure Duration: 0 hours 14 minutes 2 seconds  Findings:                 Two polyps were found in the ascending colon and                            cecum. The polyps were 2 to 3 mm in size. These                            polyps were removed with a cold snare. Resection                            and retrieval were complete.  Multiple diverticula were found in the left colon                            and right colon.                           The exam was otherwise without abnormality on                            direct and retroflexion views. Complications:            No immediate complications. Estimated blood loss:                            None. Estimated Blood Loss:     Estimated blood loss: none. Impression:               - Two 2 to 3 mm polyps in the ascending colon and                            in the cecum, removed with a cold snare. Resected                            and retrieved.                           - Diverticulosis in the left colon and in the right                            colon.                           - The examination was otherwise normal on direct                            and retroflexion views. Recommendation:           -  Repeat colonoscopy in 5 years for surveillance.                           - EGD today. Please see report.                           - Resume previous diet.                           - Continue present medications.                           - Await pathology results. Docia Chuck. Henrene Pastor, MD 06/29/2016 3:40:18 PM This report has been signed electronically.

## 2016-06-29 NOTE — Patient Instructions (Signed)
Discharge instructions given. Handouts on polyp and diverticulosis. Resume previous medications. YOU HAD AN ENDOSCOPIC PROCEDURE TODAY AT [facility]:   Refer to the procedure report that was given to you for any specific questions about what was found during the examination.  If the procedure report does not answer your questions, please call your gastroenterologist to clarify.  If you requested that your care partner not be given the details of your procedure findings, then the procedure report has been included in a sealed envelope for you to review at your convenience later.  YOU SHOULD EXPECT: Some feelings of bloating in the abdomen. Passage of more gas than usual.  Walking can help get rid of the air that was put into your GI tract during the procedure and reduce the bloating. If you had a lower endoscopy (such as a colonoscopy or flexible sigmoidoscopy) you may notice spotting of blood in your stool or on the toilet paper. If you underwent a bowel prep for your procedure, you may not have a normal bowel movement for a few days.  Please Note:  You might notice some irritation and congestion in your nose or some drainage.  This is from the oxygen used during your procedure.  There is no need for concern and it should clear up in a day or so.  SYMPTOMS TO REPORT IMMEDIATELY:   Following lower endoscopy (colonoscopy or flexible sigmoidoscopy):  Excessive amounts of blood in the stool  Significant tenderness or worsening of abdominal pains  Swelling of the abdomen that is new, acute  Fever of 100F or higher   For urgent or emergent issues, a gastroenterologist can be reached at any hour by calling [phone].   DIET:  We do recommend a small meal at first, but then you may proceed to your regular diet.  Drink plenty of fluids but you should avoid alcoholic beverages for 24 hours.  ACTIVITY:  You should plan to take it easy for the rest of today and you should NOT  DRIVE or use heavy machinery until tomorrow (because of the sedation medicines used during the test).    FOLLOW UP: Our staff will call the number listed on your records the next business day following your procedure to check on you and address any questions or concerns that you may have regarding the information given to you following your procedure. If we do not reach you, we will leave a message.  However, if you are feeling well and you are not experiencing any problems, there is no need to return our call.  We will assume that you have returned to your regular daily activities without incident.  If any biopsies were taken you will be contacted by phone or by letter within the next 1-3 weeks.  Please call us at [phone] if you have not heard about the biopsies in 3 weeks.    SIGNATURES/CONFIDENTIALITY: You and/or your care partner have signed paperwork which will be entered into your electronic medical record.  These signatures attest to the fact that that the information above on your After Visit Summary has been reviewed and is understood.  Full responsibility of the confidentiality of this discharge information lies with you and/or your care-partner.

## 2016-06-29 NOTE — Op Note (Signed)
[place] Patient Name: Ashley Wagner Procedure Date: 06/29/2016 3:13 PM MRN: [id number] Endoscopist: Docia Chuck. Henrene Pastor , MD Age: 40 Referring MD:  Date of Birth: [birth year] Gender: Female Account #: [id number] Procedure:                Upper GI endoscopy Indications:              Abdominal pain in the right upper quadrant,                            Esophageal reflux Medicines:                Monitored Anesthesia Care Procedure:                Pre-Anesthesia Assessment:                           - Prior to the procedure, a History and Physical                            was performed, and patient medications and                            allergies were reviewed. The patient's tolerance of                            previous anesthesia was also reviewed. The risks                            and benefits of the procedure and the sedation                            options and risks were discussed with the patient.                            All questions were answered, and informed consent                            was obtained. Prior Anticoagulants: The patient has                            taken no previous anticoagulant or antiplatelet                            agents. ASA Grade Assessment: II - A patient with                            mild systemic disease. After reviewing the risks                            and benefits, the patient was deemed in                            satisfactory condition to undergo the procedure.  After obtaining informed consent, the endoscope was                            passed under direct vision. Throughout the                            procedure, the patient's blood pressure, pulse, and                            oxygen saturations were monitored continuously. The                            Model GIF-[id number]) scope was introduced                            through the mouth, and advanced to the second  part                            of duodenum. The upper GI endoscopy was                            accomplished without difficulty. The patient                            tolerated the procedure well. Scope In: Scope Out: Findings:                 The esophagus was normal.                           The stomach was normal.                           The examined duodenum was normal.                           The cardia and gastric fundus were normal on                            retroflexion. Complications:            No immediate complications. Estimated Blood Loss:     Estimated blood loss: none. Impression:               - Normal EGD                           - GERD                           - Musculoskeletal quadrant pain. Recommendation:           - Reflux precautions.                           - Resume previous diet.                           - Continue present medications.                           -  Resume care with your primary provider. GI                            follow-up as needed Docia Chuck. Henrene Pastor, MD 06/29/2016 3:43:33 PM This report has been signed electronically.

## 2016-06-30 ENCOUNTER — Telehealth: Payer: Self-pay | Admitting: *Deleted

## 2016-06-30 NOTE — Telephone Encounter (Signed)
  Follow up Call-  Call back number 06/29/2016  Post procedure Call Back phone  # [phone]  Permission to leave phone message Yes  Some recent data might be hidden     Patient questions:  Do you have a fever, pain , or abdominal swelling? No. Pain Score  0 *  Have you tolerated food without any problems? Yes.    Have you been able to return to your normal activities? Yes.    Do you have any questions about your discharge instructions: Diet   No. Medications  No. Follow up visit  No.  Do you have questions or concerns about your Care? No.  Actions: * If pain score is 4 or above: No action needed, pain <4.

## 2016-07-06 ENCOUNTER — Encounter: Payer: Self-pay | Admitting: Internal Medicine

## 2016-07-14 ENCOUNTER — Ambulatory Visit (INDEPENDENT_AMBULATORY_CARE_PROVIDER_SITE_OTHER): Payer: 59 | Admitting: Family Medicine

## 2016-07-14 VITALS — BP 180/100 | HR 78 | Temp 99.1°F | Resp 18 | Wt 220.0 lb

## 2016-07-14 DIAGNOSIS — R1011 Right upper quadrant pain: Secondary | ICD-10-CM

## 2016-07-14 NOTE — Progress Notes (Signed)
Subjective:    Patient ID: Ashley Wagner, female    DOB: [birth year], 40 y.o.   MRN: [id number]  HPI7/18/17 Symptoms began approximately 1 week ago. The patient denies any knowledge of any inciting event. However she has pain in the posterior aspect of her right flank along the body of the seventh rib. The pain radiates from that position around to the front of her abdomen along the body of the rib. The rib is exquisitely tender to palpation. She also has some mild pleurisy. She denies any cough. She denies any shortness of breath. She denies any hemoptysis. She denies any abdominal pain. She denies any nausea or vomiting or hematemesis or black tarry stools. Her periods are regular and she is due to start her next period today. She denies any hematuria or dysuria.  At that time, my plan was: Symptoms are consistent with a bruised or cracked rib or possibly an intercostal muscle strain. I recommended one-week of clinical monitoring. Use ibuprofen 800 mg every 8 hours for pain. If symptoms persist in one week, proceed with imaging of the chest to rule out underlying pathology. If symptoms change prior to that she is to return immediately for reassessment  03/13/16 The patient's symptoms are worsening. She is now having pain radiating from her right upper quadrant all the way into her right back similar to pain she had when she had her gallbladder removed. She is also having pain radiated into her right shoulder blade and into her right lower quadrant. The pain is constant. It never goes away. It is now no longer brought on by palpation but is rather constant. She is also developing nausea. At times the pain intensifies. She does not have any tenderness in the right lower quadrant near the appendix but she is tender to palpation in the right upper quadrant underneath the ribs. There is no guarding there is no rebound. She denies any black tarry stools or blood in her stools. She denies any hematemesis. She  denies any vomiting. She denies any constipation. There is no evidence of jaundice or scleral icterus. She denies fever.  At that time, my plan was: Patient is obviously anxious. This is no longer sounding musculoskeletal in nature. It is possible that the patient may have intermittent common bile duct obstruction due to retained stones. Another possibility would be adhesions in this area causing pain. Obtain a CBC to evaluate for leukocytosis. Obtain a urinalysis to evaluate for hematuria although I do not believe that these are kidney stones as the pain radiates from the right flank into the right upper quadrant not into the right lower quadrant. Obtain a CMP to evaluate for elevated liver function test. Obtain a lipase to evaluate for atypical pancreatitis. I will schedule the patient for a CT scan of the abdomen and pelvis as soon as possible. Further treatment decisions pending workup. At the present time the patient appears clinically stable to proceed with outpatient workup  07/14/16 CT scan revealed: FINDINGS: Lower chest: Lung bases are clear. No effusions. Heart is normal size. Hepatobiliary: Prior cholecystectomy. Small cyst in the left hepatic lobe. No biliary ductal dilatation. Pancreas: No focal abnormality or ductal dilatation. Spleen: No focal abnormality.  Normal size. Adrenals/Urinary Tract: Small cysts in the kidneys bilaterally. No hydronephrosis. Adrenal glands and urinary bladder grossly unremarkable. Stomach/Bowel: Appendix is normal. Stomach, large and small bowel grossly unremarkable. Vascular/Lymphatic: No evidence of aneurysm or adenopathy. Reproductive: Uterus and adnexa unremarkable.  No mass. Other: No free  fluid or free air. Musculoskeletal: No acute bony abnormality or focal bone lesion. IMPRESSION: Small hepatic and bilateral renal cysts. No acute findings in the abdomen or pelvis.  Given the relatively normal findings of her CAT scan, the patient was  referred to GI. Gastroenterology performed an EGD as well as a colonoscopy and No Source for Abdominal Pain. She Continues to Complain of Pain in the Right Upper Quadrant and in the Right Rib Cage Area. The Pain Is Deep and Internal. It Comes and Goes. It Is Present Every Day but Can Cyclically Be Worse. She Denies Any Nausea Vomiting or Weight Loss. She Denies Any Fevers or Chills. She Denies Any Hematuria or Blood in Her Stool  Past Medical History:  Diagnosis Date  . Abnormal Pap smear 2001   cin1 and cin2 colpo 05/2002  . Chlamydia 2000   treated  . HSV-2 infection   . Hypertension    Past Surgical History:  Procedure Laterality Date  . CESAREAN SECTION  2004   x 1  . CHOLECYSTECTOMY  07/30/15  . DILATATION & CURETTAGE/HYSTEROSCOPY WITH TRUECLEAR N/A 09/07/2014   Procedure: DILATATION & CURETTAGE/HYSTEROSCOPY WITH TRUCLEAR;  Surgeon: Crawford Givens, MD;  Location: [facility];  Service: Gynecology;  Laterality: N/A;  . LEEP  2003  . TOOTH EXTRACTION     Current Outpatient Prescriptions on File Prior to Visit  Medication Sig Dispense Refill  . fluticasone (FLONASE) 50 MCG/ACT nasal spray Place 2 sprays into both nostrils daily as needed for allergies or rhinitis.    Marland Kitchen MIBELAS 24 FE 1-20 MG-MCG(24) CHEW CHEW 1 TABLET BY MOUTH DAILY  6  . naproxen sodium (ANAPROX) 220 MG tablet Take 220 mg by mouth daily as needed (pain).    Marland Kitchen omeprazole (PRILOSEC) 40 MG capsule Take 40 mg by mouth daily as needed.    . valACYclovir (VALTREX) 500 MG tablet Take 500 mg by mouth daily as needed (outbreaks).      Current Facility-Administered Medications on File Prior to Visit  Medication Dose Route Frequency Provider Last Rate Last Dose  . 0.9 %  sodium chloride infusion  500 mL Intravenous Continuous Irene Shipper, MD       No Known Allergies Social History   Social History  . Marital status: Married    Spouse name: N/A  . Number of children: 1  . Years of education: N/A   Occupational History  .  operations Government social research officer    Social History Main Topics  . Smoking status: Former Smoker    Types: Cigarettes    Quit date: 08/27/2001  . Smokeless tobacco: Never Used  . Alcohol use 0.6 oz/week    1 Glasses of wine per week     Comment: social  . Drug use: No  . Sexual activity: Yes    Partners: Male    Birth control/ protection: None   Other Topics Concern  . Not on file   Social History Narrative  . No narrative on file      Review of Systems  All other systems reviewed and are negative.      Objective:   Physical Exam  Constitutional: She appears well-developed and well-nourished.  Eyes: No scleral icterus.  Cardiovascular: Normal rate, regular rhythm and normal heart sounds.   Pulmonary/Chest: Effort normal and breath sounds normal. No respiratory distress. She has no wheezes. She has no rales. She exhibits tenderness.  Abdominal: Soft. Bowel sounds are normal. She exhibits no distension and no mass. There is tenderness.  There is no rebound and no guarding.  Vitals reviewed.         Assessment & Plan:  RUQ abdominal pain - Plan: Ambulatory referral to General Surgery  At this point pain can either be referred pain from thoracic degenerative disc disease versus adhesions from her previous cholecystectomy versus endometriosis. We discussed performing an MRI of the thoracic spine versus a referral to Gen. surgery to consider a exploratory laparoscopy for possible lysis of adhesions. After a long discussion, the patient would like to discuss with the surgeon. We will arrange this as soon as possible. Patient is not pain/drug seeking. She is very concerned and wants peace of mind more than anything else.

## 2016-07-27 ENCOUNTER — Telehealth: Payer: Self-pay | Admitting: Family Medicine

## 2016-07-27 DIAGNOSIS — M5134 Other intervertebral disc degeneration, thoracic region: Secondary | ICD-10-CM

## 2016-07-27 NOTE — Telephone Encounter (Signed)
-----   Message from Alycia Rossetti, MD sent at 07/24/2016 10:23 AM EST ----- This is Tom- Recommend MRI of T spine and notify patient what's going on ----- Message ----- From: Olena Mater, LPN Sent: [id number]   4:21 PM To: Susy Frizzle, MD  I called [facility], explain situation to them per OV notes.  She spoke with one of her surgeons.  They feel back pain and endometriosis need to be evaluated before they will do surgery on her.  Please advise ----- Message ----- From: Susy Frizzle, MD Sent: 07/15/2016   4:52 PM To: Olena Mater, LPN  I believe the patient may have right upper quadrant pain secondary to adhesions from her previous gallbladder surgery. The only way to diagnose this would be laparoscopically. Therefore I would like her to see a general surgeon to see if they would be in agreement.  Can we speak to the schedulers and explain the situation. The patient is not a drug seeking or pain seeking patient. She wants clinical evaluation for peace of mind. CT scan has been normal, GI consultation has been unrevealing with normal EGD and normal colonoscopy. If [facility] is unable to help [place], can we contact surgeon in [place].   ----- Message ----- From: Olena Mater, LPN Sent: [id number]   2:20 PM To: Susy Frizzle, MD  [facility] has declined referral.  Says " We do not see for just "pain" - The pain has to be diagnosed before we can schedule an appointment"  Please advise??

## 2016-07-27 NOTE — Telephone Encounter (Signed)
Pt called back and made aware of referral situation.  MRI ordered and pt will call her gyn for appt about endometriosis.

## 2016-07-27 NOTE — Telephone Encounter (Signed)
LMTCB to discuss recommendations.

## 2016-08-07 ENCOUNTER — Ambulatory Visit
Admission: RE | Admit: 2016-08-07 | Discharge: 2016-08-07 | Disposition: A | Discharge status: Home / Self Care | Payer: 59 | Source: Ambulatory Visit | Attending: Family Medicine | Admitting: Family Medicine

## 2016-08-07 DIAGNOSIS — M5134 Other intervertebral disc degeneration, thoracic region: Secondary | ICD-10-CM

## 2016-09-11 ENCOUNTER — Other Ambulatory Visit: Payer: Self-pay | Admitting: Obstetrics and Gynecology

## 2016-09-11 DIAGNOSIS — N840 Polyp of corpus uteri: Secondary | ICD-10-CM | POA: Diagnosis not present

## 2016-09-11 DIAGNOSIS — N920 Excessive and frequent menstruation with regular cycle: Secondary | ICD-10-CM | POA: Diagnosis not present

## 2016-09-24 DIAGNOSIS — Z1231 Encounter for screening mammogram for malignant neoplasm of breast: Secondary | ICD-10-CM | POA: Diagnosis not present

## 2016-09-24 DIAGNOSIS — D069 Carcinoma in situ of cervix, unspecified: Secondary | ICD-10-CM | POA: Diagnosis not present

## 2016-09-24 DIAGNOSIS — Z01419 Encounter for gynecological examination (general) (routine) without abnormal findings: Secondary | ICD-10-CM | POA: Diagnosis not present

## 2016-10-20 DIAGNOSIS — Z713 Dietary counseling and surveillance: Secondary | ICD-10-CM | POA: Diagnosis not present

## 2016-10-20 DIAGNOSIS — Z136 Encounter for screening for cardiovascular disorders: Secondary | ICD-10-CM | POA: Diagnosis not present

## 2016-10-20 DIAGNOSIS — R030 Elevated blood-pressure reading, without diagnosis of hypertension: Secondary | ICD-10-CM | POA: Diagnosis not present

## 2017-02-28 IMAGING — MR MR THORACIC SPINE W/O CM
4 of 6 series · 13 of 48 positions shown · non-contrast
Comparison: None.

CLINICAL DATA: Right rib and thoracic spine pain since October 2015.
No known injury.

EXAM:
MRI THORACIC SPINE WITHOUT CONTRAST
TECHNIQUE: Multiplanar, multisequence MR imaging of the thoracic spine was
performed. No intravenous contrast was administered.

[Series 5: T2 · sagittal · 4.0mm · 0.33mm/px · 4 of 12 slices shown (1 of 3)]
[im 1/12]
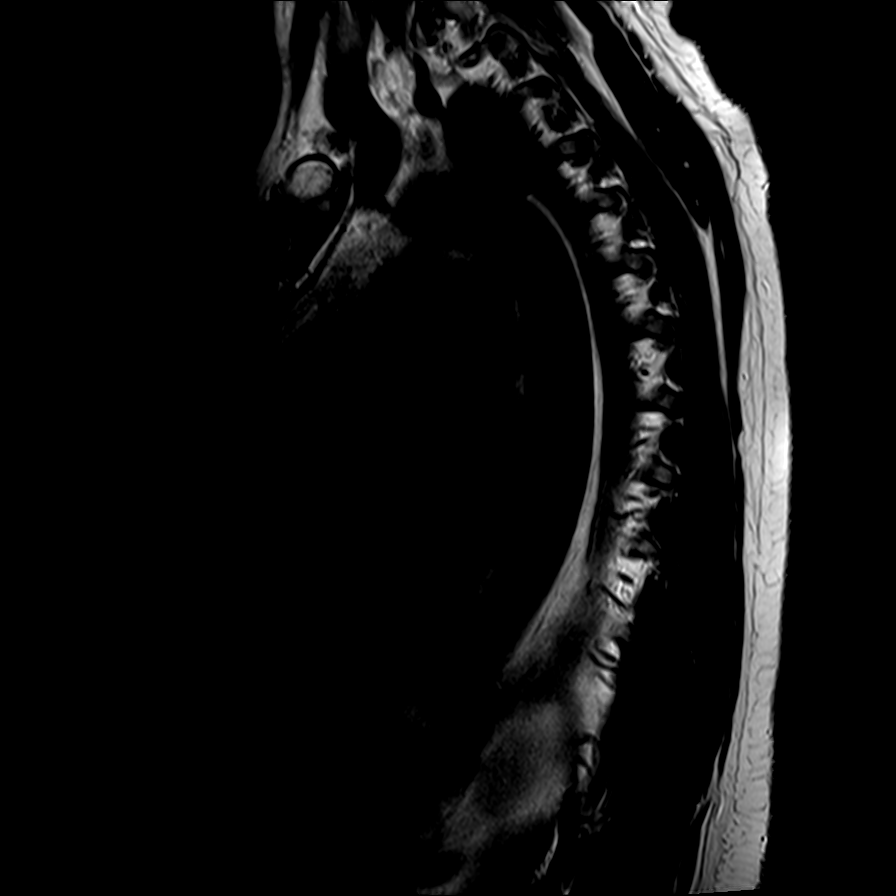
[im 3/12]
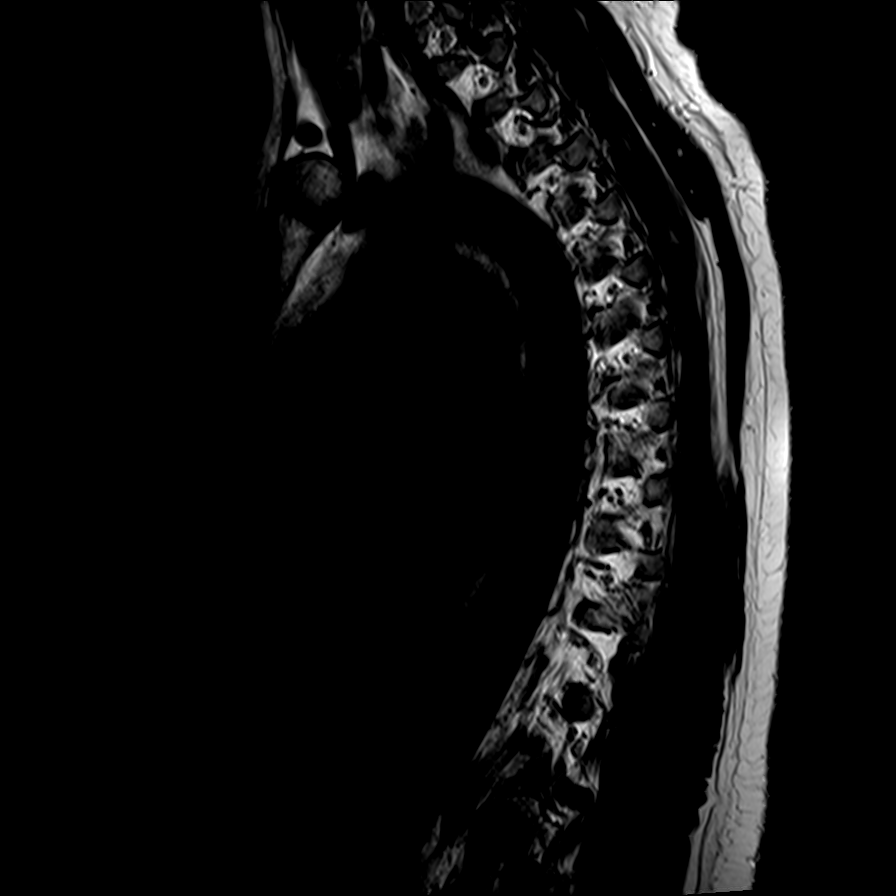
[im 7/12]
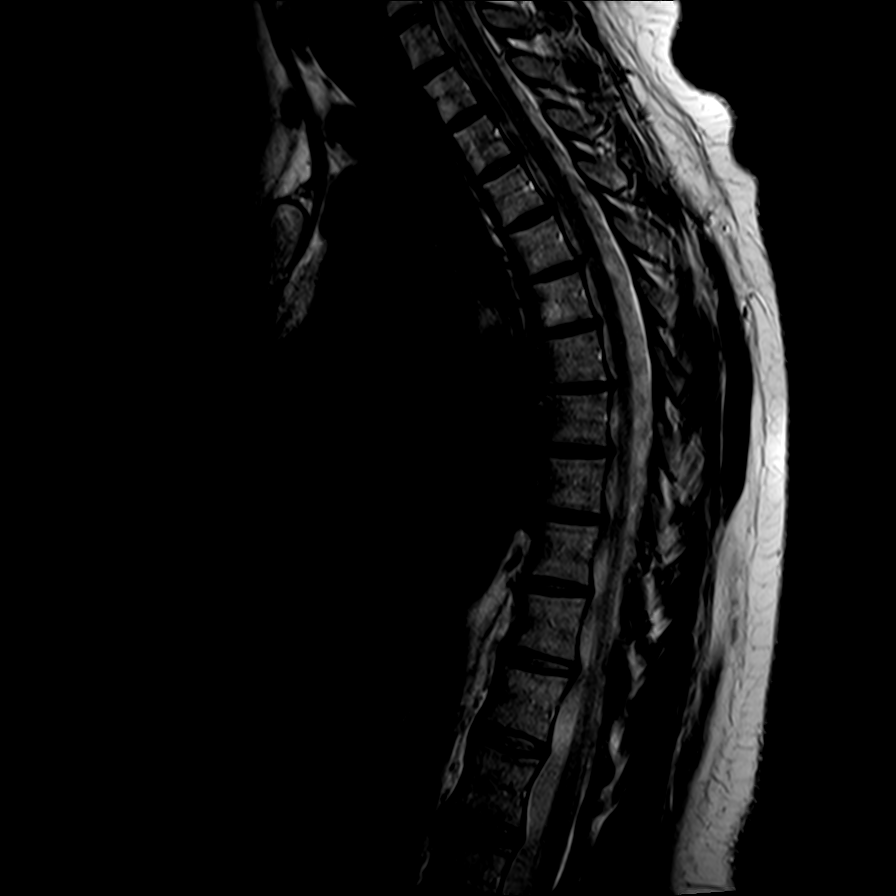
[im 12/12]
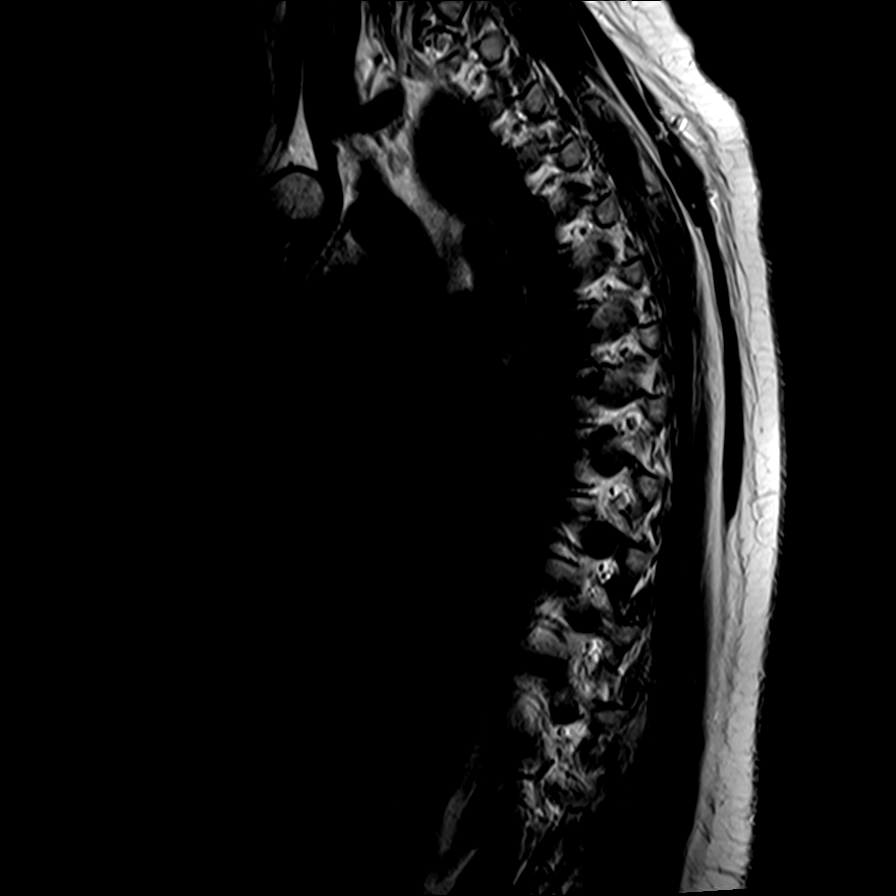

[Series 6: T1 · sagittal · 4.0mm · 0.67mm/px · 3 of 12 slices shown]
[im 3/12]
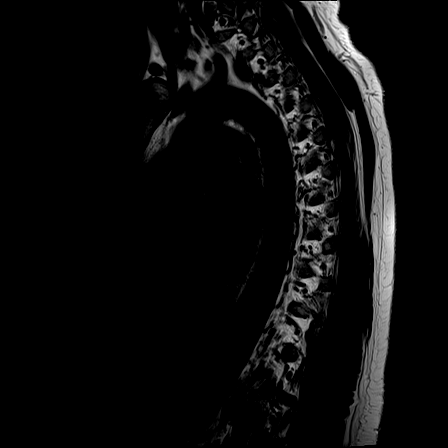
[im 7/12]
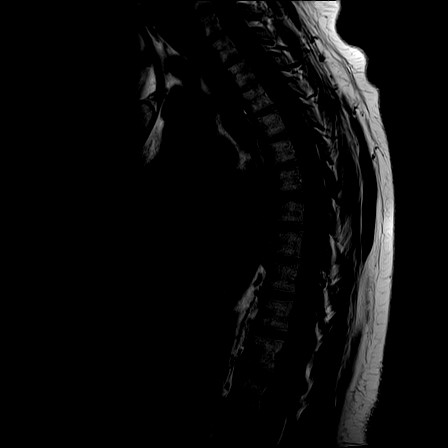
[im 12/12]
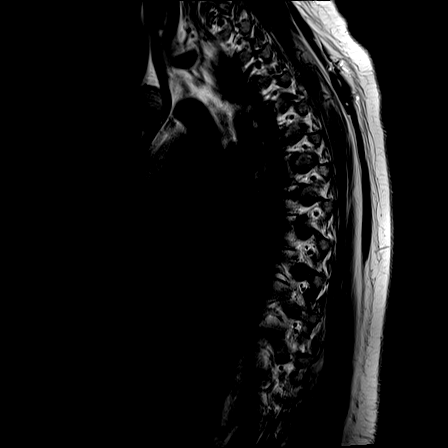

[Series 8: T2 · axial · 4.0mm · 0.39mm/px · z∈[-14,+81]mm · 3 of 30 slices shown (2 of 3)]
[im 5/30]
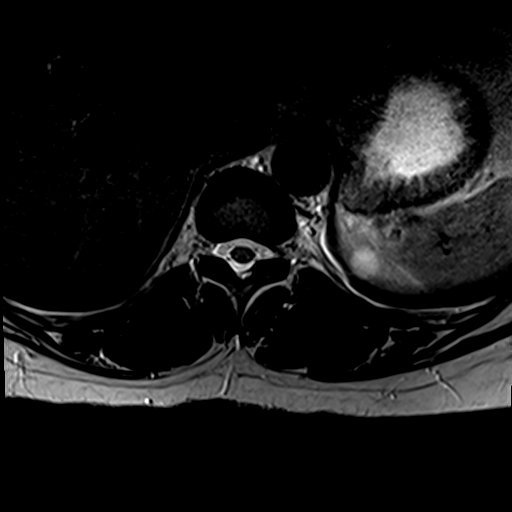
[im 16/30]
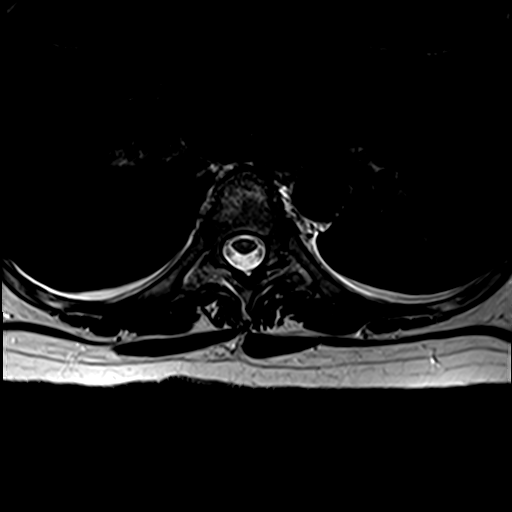
[im 25/30]
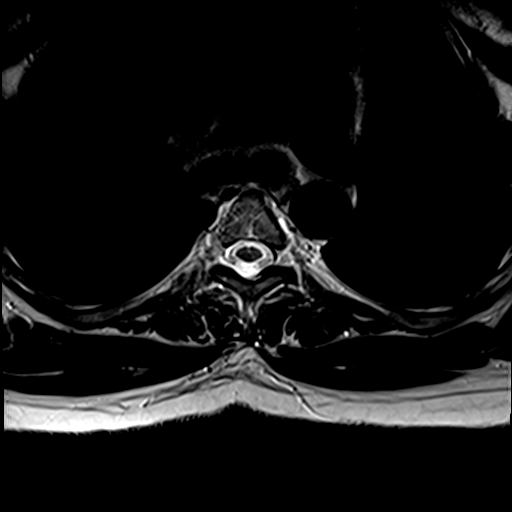

[Series 9: T2 · axial · 4.0mm · 0.39mm/px · z∈[-16,+84]mm · 3 of 30 slices shown (3 of 3)]
[im 5/30]
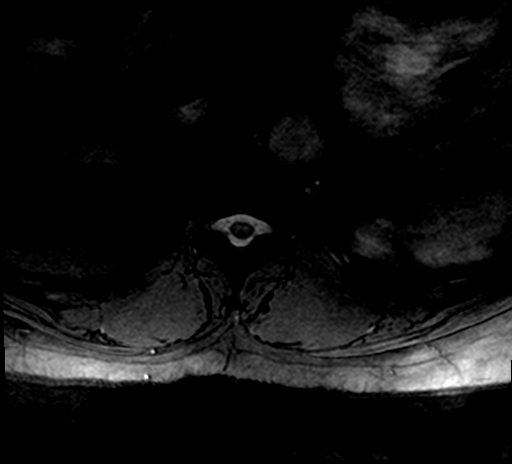
[im 16/30]
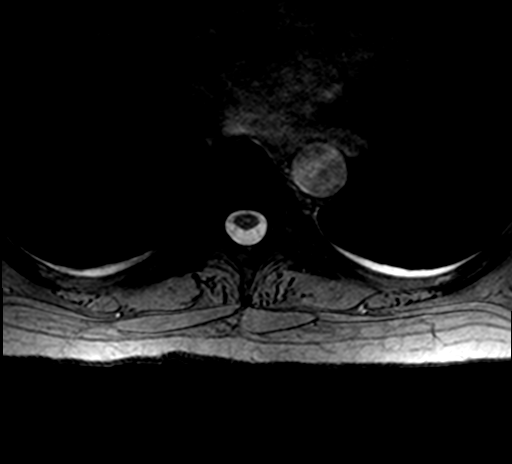
[im 25/30]
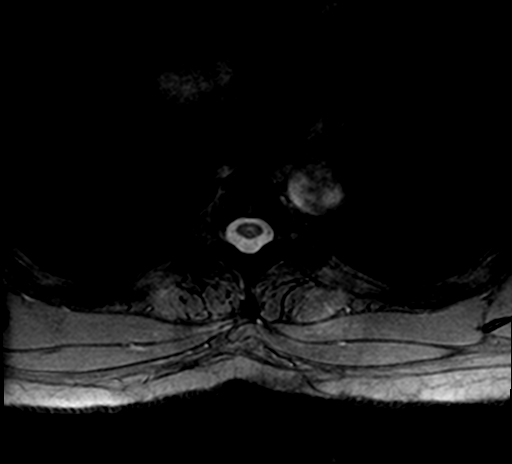

[13 of 48 positions shown; findings below may reference images not displayed]

FINDINGS: Alignment:  Maintained.

Vertebrae: No fracture or worrisome lesion. Small hemangioma in T6
incidentally noted.

Cord:  Normal signal throughout.

Paraspinal and other soft tissues: Small T2 hyperintense lesions in
the kidneys are most consistent with cysts.

Disc levels:

T2-3: Shallow left paracentral protrusion without central canal or
foraminal stenosis.

T5-6: Minimal disc bulge without central canal or foraminal
narrowing.

T6-7: Shallow disc bulge without central canal or foraminal
stenosis.

T7-8: Shallow disc bulge without central canal or foraminal
stenosis.

T8-9: Shallow disc bulge without central canal or foraminal
stenosis.

Except as noted above, intervertebral disc spaces are unremarkable.
IMPRESSION: Mild thoracic degenerative change without central canal or foraminal
narrowing. No finding to explain the patient's symptoms.

## 2017-10-05 DIAGNOSIS — Z1231 Encounter for screening mammogram for malignant neoplasm of breast: Secondary | ICD-10-CM | POA: Diagnosis not present

## 2017-10-05 DIAGNOSIS — Z01419 Encounter for gynecological examination (general) (routine) without abnormal findings: Secondary | ICD-10-CM | POA: Diagnosis not present

## 2017-10-05 DIAGNOSIS — N920 Excessive and frequent menstruation with regular cycle: Secondary | ICD-10-CM | POA: Diagnosis not present

## 2017-10-05 DIAGNOSIS — Z124 Encounter for screening for malignant neoplasm of cervix: Secondary | ICD-10-CM | POA: Diagnosis not present

## 2017-10-17 DIAGNOSIS — Z136 Encounter for screening for cardiovascular disorders: Secondary | ICD-10-CM | POA: Diagnosis not present

## 2017-10-17 DIAGNOSIS — Z23 Encounter for immunization: Secondary | ICD-10-CM | POA: Diagnosis not present

## 2017-10-17 DIAGNOSIS — Z713 Dietary counseling and surveillance: Secondary | ICD-10-CM | POA: Diagnosis not present

## 2018-10-12 DIAGNOSIS — Z1231 Encounter for screening mammogram for malignant neoplasm of breast: Secondary | ICD-10-CM | POA: Diagnosis not present

## 2018-10-12 DIAGNOSIS — Z6832 Body mass index (BMI) 32.0-32.9, adult: Secondary | ICD-10-CM | POA: Diagnosis not present

## 2018-10-12 DIAGNOSIS — Z124 Encounter for screening for malignant neoplasm of cervix: Secondary | ICD-10-CM | POA: Diagnosis not present

## 2018-10-12 DIAGNOSIS — Z01419 Encounter for gynecological examination (general) (routine) without abnormal findings: Secondary | ICD-10-CM | POA: Diagnosis not present

## 2018-10-12 DIAGNOSIS — Z113 Encounter for screening for infections with a predominantly sexual mode of transmission: Secondary | ICD-10-CM | POA: Diagnosis not present

## 2019-02-27 ENCOUNTER — Other Ambulatory Visit: Payer: Self-pay

## 2019-02-27 ENCOUNTER — Ambulatory Visit (INDEPENDENT_AMBULATORY_CARE_PROVIDER_SITE_OTHER): Payer: 59 | Admitting: Family Medicine

## 2019-02-27 DIAGNOSIS — J0110 Acute frontal sinusitis, unspecified: Secondary | ICD-10-CM

## 2019-02-27 MED ORDER — FLUTICASONE PROPIONATE 50 MCG/ACT NA SUSP: 2.0000 | Freq: Every day | NASAL | 6 refills | Status: DC

## 2019-02-27 MED ORDER — FLUCONAZOLE 150 MG PO TABS: 150.0000 mg | ORAL_TABLET | Freq: Once | ORAL | 0 refills | Status: AC

## 2019-02-27 MED ORDER — AMOXICILLIN-POT CLAVULANATE 875-125 MG PO TABS: 1.0000 | ORAL_TABLET | Freq: Two times a day (BID) | ORAL | 0 refills | Status: DC

## 2019-02-27 NOTE — Progress Notes (Signed)
Subjective:    Patient ID: Ashley Wagner, female    DOB: June 12, 1976, 43 y.o.   MRN: [id number]  HPI  Patient is being seen today as a telephone visit.  Patient consents to be seen by telephone.  Phone call began at 1230.  Phone call concluded at 1240. Patient states that for the last 2 months, whenever she leans forward, her left nostril running clear fluid.  She also reports pressure and a headache in her left frontal and left maxillary sinus every morning.  She does not feel extremely congested however she does feel pain and pressure in this area along with the rhinorrhea that seems to occur with position changes.  Temperature changes do not cause the rhinorrhea.  She denies any fever but she does have a dull headache every morning.  She is tried Flonase with no relief.  She denies any sneezing, sore scratchy throat, cough.  She denies any vision changes or otalgia or dental pain Past Medical History:  Diagnosis Date  . Abnormal Pap smear 2001   cin1 and cin2 colpo 05/2002  . Chlamydia 2000   treated  . HSV-2 infection   . Hypertension    Past Surgical History:  Procedure Laterality Date  . CESAREAN SECTION  2004   x 1  . CHOLECYSTECTOMY  07/30/15  . DILATATION & CURETTAGE/HYSTEROSCOPY WITH TRUECLEAR N/A 09/07/2014   Procedure: DILATATION & CURETTAGE/HYSTEROSCOPY WITH TRUCLEAR;  Surgeon: Crawford Givens, MD;  Location: [facility];  Service: Gynecology;  Laterality: N/A;  . LEEP  2003  . TOOTH EXTRACTION     Current Outpatient Medications on File Prior to Visit  Medication Sig Dispense Refill  . fluticasone (FLONASE) 50 MCG/ACT nasal spray Place 2 sprays into both nostrils daily as needed for allergies or rhinitis.    Marland Kitchen MIBELAS 24 FE 1-20 MG-MCG(24) CHEW CHEW 1 TABLET BY MOUTH DAILY  6  . naproxen sodium (ANAPROX) 220 MG tablet Take 220 mg by mouth daily as needed (pain).    Marland Kitchen omeprazole (PRILOSEC) 40 MG capsule Take 40 mg by mouth daily as needed.    . valACYclovir (VALTREX) 500 MG  tablet Take 500 mg by mouth daily as needed (outbreaks).      Current Facility-Administered Medications on File Prior to Visit  Medication Dose Route Frequency Provider Last Rate Last Dose  . 0.9 %  sodium chloride infusion  500 mL Intravenous Continuous Irene Shipper, MD       No Known Allergies Social History   Socioeconomic History  . Marital status: Married    Spouse name: Not on file  . Number of children: 1  . Years of education: Not on file  . Highest education level: Not on file  Occupational History  . Occupation: Event organiser  Social Needs  . Financial resource strain: Not on file  . Food insecurity    Worry: Not on file    Inability: Not on file  . Transportation needs    Medical: Not on file    Non-medical: Not on file  Tobacco Use  . Smoking status: Former Smoker    Types: Cigarettes    Quit date: 08/27/2001    Years since quitting: 17.5  . Smokeless tobacco: Never Used  Substance and Sexual Activity  . Alcohol use: Yes    Alcohol/week: 1.0 standard drinks    Types: 1 Glasses of wine per week    Comment: social  . Drug use: No  . Sexual activity: Yes  Partners: Male    Birth control/protection: None  Lifestyle  . Physical activity    Days per week: Not on file    Minutes per session: Not on file  . Stress: Not on file  Relationships  . Social Herbalist on phone: Not on file    Gets together: Not on file    Attends religious service: Not on file    Active member of club or organization: Not on file    Attends meetings of clubs or organizations: Not on file    Relationship status: Not on file  . Intimate partner violence    Fear of current or ex partner: Not on file    Emotionally abused: Not on file    Physically abused: Not on file    Forced sexual activity: Not on file  Other Topics Concern  . Not on file  Social History Narrative  . Not on file      Review of Systems  All other systems reviewed and are  negative.      Objective:   Physical Exam   Physical exam could not be performed as the patient was seen over the telephone     Assessment & Plan:  The encounter diagnosis was Subacute frontal sinusitis. I believe the patient has a subacute sinus infection.  Begin Augmentin 875 mg p.o. twice daily for 10 days coupled with Flonase 2 sprays each nostril daily.  Reassess in 7 to 10 days for any improvement or worsening of her symptoms

## 2019-12-25 DIAGNOSIS — Z124 Encounter for screening for malignant neoplasm of cervix: Secondary | ICD-10-CM | POA: Diagnosis not present

## 2019-12-25 DIAGNOSIS — Z01419 Encounter for gynecological examination (general) (routine) without abnormal findings: Secondary | ICD-10-CM | POA: Diagnosis not present

## 2019-12-25 DIAGNOSIS — Z3040 Encounter for surveillance of contraceptives, unspecified: Secondary | ICD-10-CM | POA: Diagnosis not present

## 2019-12-25 DIAGNOSIS — Z1231 Encounter for screening mammogram for malignant neoplasm of breast: Secondary | ICD-10-CM | POA: Diagnosis not present

## 2019-12-25 DIAGNOSIS — R102 Pelvic and perineal pain: Secondary | ICD-10-CM | POA: Diagnosis not present

## 2019-12-25 DIAGNOSIS — Z1239 Encounter for other screening for malignant neoplasm of breast: Secondary | ICD-10-CM | POA: Diagnosis not present

## 2019-12-25 DIAGNOSIS — Z113 Encounter for screening for infections with a predominantly sexual mode of transmission: Secondary | ICD-10-CM | POA: Diagnosis not present

## 2020-01-19 ENCOUNTER — Encounter: Payer: Self-pay | Admitting: Family Medicine

## 2020-01-26 ENCOUNTER — Ambulatory Visit
Admission: RE | Admit: 2020-01-26 | Discharge: 2020-01-26 | Disposition: A | Discharge status: Home / Self Care | Payer: BC Managed Care – PPO | Source: Ambulatory Visit | Attending: Family Medicine | Admitting: Family Medicine

## 2020-01-26 ENCOUNTER — Ambulatory Visit (INDEPENDENT_AMBULATORY_CARE_PROVIDER_SITE_OTHER): Payer: BC Managed Care – PPO | Admitting: Family Medicine

## 2020-01-26 ENCOUNTER — Other Ambulatory Visit: Payer: Self-pay | Admitting: Family Medicine

## 2020-01-26 ENCOUNTER — Other Ambulatory Visit: Payer: Self-pay

## 2020-01-26 VITALS — BP 152/70 | HR 88 | Temp 95.3°F | Wt 220.0 lb

## 2020-01-26 DIAGNOSIS — Z0001 Encounter for general adult medical examination with abnormal findings: Secondary | ICD-10-CM

## 2020-01-26 DIAGNOSIS — R0781 Pleurodynia: Secondary | ICD-10-CM

## 2020-01-26 DIAGNOSIS — Z0000 Encounter for general adult medical examination without abnormal findings: Secondary | ICD-10-CM | POA: Diagnosis not present

## 2020-01-26 LAB — CBC WITH DIFFERENTIAL/PLATELET
Absolute Monocytes: 462 cells/uL (ref 200–950)
Basophils Absolute: 49 cells/uL (ref 0–200)
Basophils Relative: 0.7 %
Eosinophils Absolute: 301 cells/uL (ref 15–500)
Eosinophils Relative: 4.3 %
HCT: 41.4 % (ref 35.0–45.0)
Hemoglobin: 13.7 g/dL (ref 11.7–15.5)
Lymphs Abs: 1666 cells/uL (ref 850–3900)
MCH: 31.9 pg (ref 27.0–33.0)
MCHC: 33.1 g/dL (ref 32.0–36.0)
MCV: 96.5 fL (ref 80.0–100.0)
MPV: 10.2 fL (ref 7.5–12.5)
Monocytes Relative: 6.6 %
Neutro Abs: 4522 cells/uL (ref 1500–7800)
Neutrophils Relative %: 64.6 %
Platelets: 287 10*3/uL (ref 140–400)
RBC: 4.29 10*6/uL (ref 3.80–5.10)
RDW: 11.9 % (ref 11.0–15.0)
Total Lymphocyte: 23.8 %
WBC: 7 10*3/uL (ref 3.8–10.8)

## 2020-01-26 LAB — COMPLETE METABOLIC PANEL WITH GFR
AG Ratio: 1.5 (calc) (ref 1.0–2.5)
ALT: 14 U/L (ref 6–29)
AST: 16 U/L (ref 10–30)
Albumin: 4.1 g/dL (ref 3.6–5.1)
Alkaline phosphatase (APISO): 45 U/L (ref 31–125)
BUN: 17 mg/dL (ref 7–25)
CO2: 26 mmol/L (ref 20–32)
Calcium: 9 mg/dL (ref 8.6–10.2)
Chloride: 107 mmol/L (ref 98–110)
Creat: 1.03 mg/dL (ref 0.50–1.10)
GFR, Est African American: 77 mL/min/{1.73_m2} (ref >=60)
GFR, Est Non African American: 66 mL/min/{1.73_m2} (ref >=60)
Globulin: 2.7 g/dL (calc) (ref 1.9–3.7)
Glucose, Bld: 87 mg/dL (ref 65–99)
Potassium: 4.6 mmol/L (ref 3.5–5.3)
Sodium: 140 mmol/L (ref 135–146)
Total Bilirubin: 0.4 mg/dL (ref 0.2–1.2)
Total Protein: 6.8 g/dL (ref 6.1–8.1)

## 2020-01-26 LAB — LIPID PANEL
Cholesterol: 170 mg/dL (ref <200)
HDL: 76 mg/dL (ref >=50)
LDL Cholesterol (Calc): 81 mg/dL (calc)
Non-HDL Cholesterol (Calc): 94 mg/dL (calc) (ref <130)
Total CHOL/HDL Ratio: 2.2 (calc) (ref <5.0)
Triglycerides: 53 mg/dL (ref <150)

## 2020-01-26 MED ORDER — HYDROCHLOROTHIAZIDE 25 MG PO TABS
25.0000 mg | ORAL_TABLET | Freq: Every day | ORAL | 3 refills | Status: DC
Start: 2020-01-26 — End: 2021-10-14

## 2020-01-26 NOTE — Progress Notes (Signed)
Subjective:    Patient ID: Ashley Wagner, female    DOB: August 22, 1976, 44 y.o.   MRN: [id number]  HPI  Patient is here today for complete physical exam.  Please see my last office visit.  The last 4 years, patient has been suffering from right upper quadrant pain.  Today she is tender to palpation over the lower right ribs in the mid axillary line and right below her breast.  The pain is constant.  Occasionally she has to take ibuprofen for the pain.  She had an exhaustive work-up a few years ago including a CT scan as well as an MRI of the thoracic spine.  At that time I felt it was most likely adhesions from a previous cholecystectomy.  She has an appointment to see her gynecologist to discuss having an endometrial ablation and a tubal ligation upcoming.  They are planning to evaluate for possible endometriosis.  Her Pap smear was just performed and was normal.  Her mammogram was normal.  She had a colonoscopy in 2017 and is due for another colonoscopy in 2022.  Her immunizations are up-to-date except for tetanus shot.  Her blood pressure today is elevated verify at this point self.  Past Medical History:  Diagnosis Date  . Abnormal Pap smear 2001   cin1 and cin2 colpo 05/2002  . Chlamydia 2000   treated  . HSV-2 infection   . Hypertension    Past Surgical History:  Procedure Laterality Date  . CESAREAN SECTION  2004   x 1  . CHOLECYSTECTOMY  07/30/15  . DILATATION & CURETTAGE/HYSTEROSCOPY WITH TRUECLEAR N/A 09/07/2014   Procedure: DILATATION & CURETTAGE/HYSTEROSCOPY WITH TRUCLEAR;  Surgeon: Crawford Givens, MD;  Location: [facility];  Service: Gynecology;  Laterality: N/A;  . LEEP  2003  . TOOTH EXTRACTION     Current Outpatient Medications on File Prior to Visit  Medication Sig Dispense Refill  . fluticasone (FLONASE) 50 MCG/ACT nasal spray Place 2 sprays into both nostrils daily. 16 g 6  . MIBELAS 24 FE 1-20 MG-MCG(24) CHEW CHEW 1 TABLET BY MOUTH DAILY  6  . naproxen sodium (ANAPROX) 220  MG tablet Take 220 mg by mouth daily as needed (pain).    . valACYclovir (VALTREX) 500 MG tablet Take 500 mg by mouth daily as needed (outbreaks).     . metroNIDAZOLE (FLAGYL) 500 MG tablet Take 500 mg by mouth 2 (two) times daily.    Marland Kitchen omeprazole (PRILOSEC) 40 MG capsule Take 40 mg by mouth daily as needed.     Current Facility-Administered Medications on File Prior to Visit  Medication Dose Route Frequency Provider Last Rate Last Admin  . 0.9 %  sodium chloride infusion  500 mL Intravenous Continuous Irene Shipper, MD       No Known Allergies Social History   Socioeconomic History  . Marital status: Married    Spouse name: Not on file  . Number of children: 1  . Years of education: Not on file  . Highest education level: Not on file  Occupational History  . Occupation: Event organiser  Tobacco Use  . Smoking status: Former Smoker    Types: Cigarettes    Quit date: 08/27/2001    Years since quitting: 18.4  . Smokeless tobacco: Never Used  Substance and Sexual Activity  . Alcohol use: Yes    Alcohol/week: 1.0 standard drinks    Types: 1 Glasses of wine per week    Comment: social  . Drug use:  No  . Sexual activity: Yes    Partners: Male    Birth control/protection: None  Other Topics Concern  . Not on file  Social History Narrative  . Not on file   Social Determinants of Health   Financial Resource Strain:   . Difficulty of Paying Living Expenses:   Food Insecurity:   . Worried About Charity fundraiser in the Last Year:   . Arboriculturist in the Last Year:   Transportation Needs:   . Film/video editor (Medical):   Marland Kitchen Lack of Transportation (Non-Medical):   Physical Activity:   . Days of Exercise per Week:   . Minutes of Exercise per Session:   Stress:   . Feeling of Stress :   Social Connections:   . Frequency of Communication with Friends and Family:   . Frequency of Social Gatherings with Friends and Family:   . Attends Religious Services:    . Active Member of Clubs or Organizations:   . Attends Archivist Meetings:   Marland Kitchen Marital Status:   Intimate Partner Violence:   . Fear of Current or Ex-Partner:   . Emotionally Abused:   Marland Kitchen Physically Abused:   . Sexually Abused:       Review of Systems  All other systems reviewed and are negative.      Objective:   Physical Exam  Constitutional: She is oriented to person, place, and time. She appears well-developed and well-nourished. No distress.  HENT:  Head: Normocephalic and atraumatic.  Right Ear: External ear normal.  Left Ear: External ear normal.  Nose: Nose normal.  Mouth/Throat: Oropharynx is clear and moist. No oropharyngeal exudate.  Eyes: Pupils are equal, round, and reactive to light. Conjunctivae and EOM are normal. Right eye exhibits no discharge. Left eye exhibits no discharge. No scleral icterus.  Neck: No JVD present. No tracheal deviation present. No thyromegaly present.  Cardiovascular: Normal rate, regular rhythm, normal heart sounds and intact distal pulses. Exam reveals no friction rub.  No murmur heard. Pulmonary/Chest: Effort normal and breath sounds normal. No stridor. No respiratory distress. She has no wheezes. She has no rales. She exhibits no tenderness.  Abdominal: Soft. Bowel sounds are normal. She exhibits no distension and no mass. There is abdominal tenderness. There is no rebound and no guarding.    Musculoskeletal:        General: No tenderness, deformity or edema. Normal range of motion.     Cervical back: Normal range of motion and neck supple.  Lymphadenopathy:    She has no cervical adenopathy.  Neurological: She is alert and oriented to person, place, and time. She has normal reflexes. She displays normal reflexes. No cranial nerve deficit. She exhibits normal muscle tone. Coordination normal.  Skin: Skin is warm. No rash noted. She is not diaphoretic. No erythema. No pallor.  Psychiatric: She has a normal mood and affect.  Her behavior is normal. Judgment and thought content normal.  Vitals reviewed.         Assessment & Plan:  General medical exam - Plan: CBC with Differential/Platelet, COMPLETE METABOLIC PANEL WITH GFR, Lipid panel  Rib pain - Plan: DG Ribs Unilateral Right  Morbid obesity ([place])  Patient's blood pressure is elevated.  I have asked the patient check her blood pressure at home and if her blood pressure remains greater than 140/90 I would recommend starting hydrochlorothiazide 25 mg a day.  Check CBC, CMP, fasting lipid panel.  Mammogram and Pap  smear are up-to-date.  Colonoscopy is due next year.  Immunizations are up-to-date.  Recommended a tetanus shot.  Obtain an x-ray of the ribs given the pain she is feeling of the rib body.  However I suspect either endometriosis or adhesions is most likely causing her pain.

## 2020-08-18 IMAGING — CR DG RIBS W/ CHEST 3+V*R*
3 series · 3 of 3 positions shown · non-contrast
Comparison: None.

CLINICAL DATA: Rib pain. Chronic LOWER RIGHT anterior chest and rib
pain since 2852, worse this year.

EXAM:
RIGHT RIBS AND CHEST - 3+ VIEW

[w chest pa]
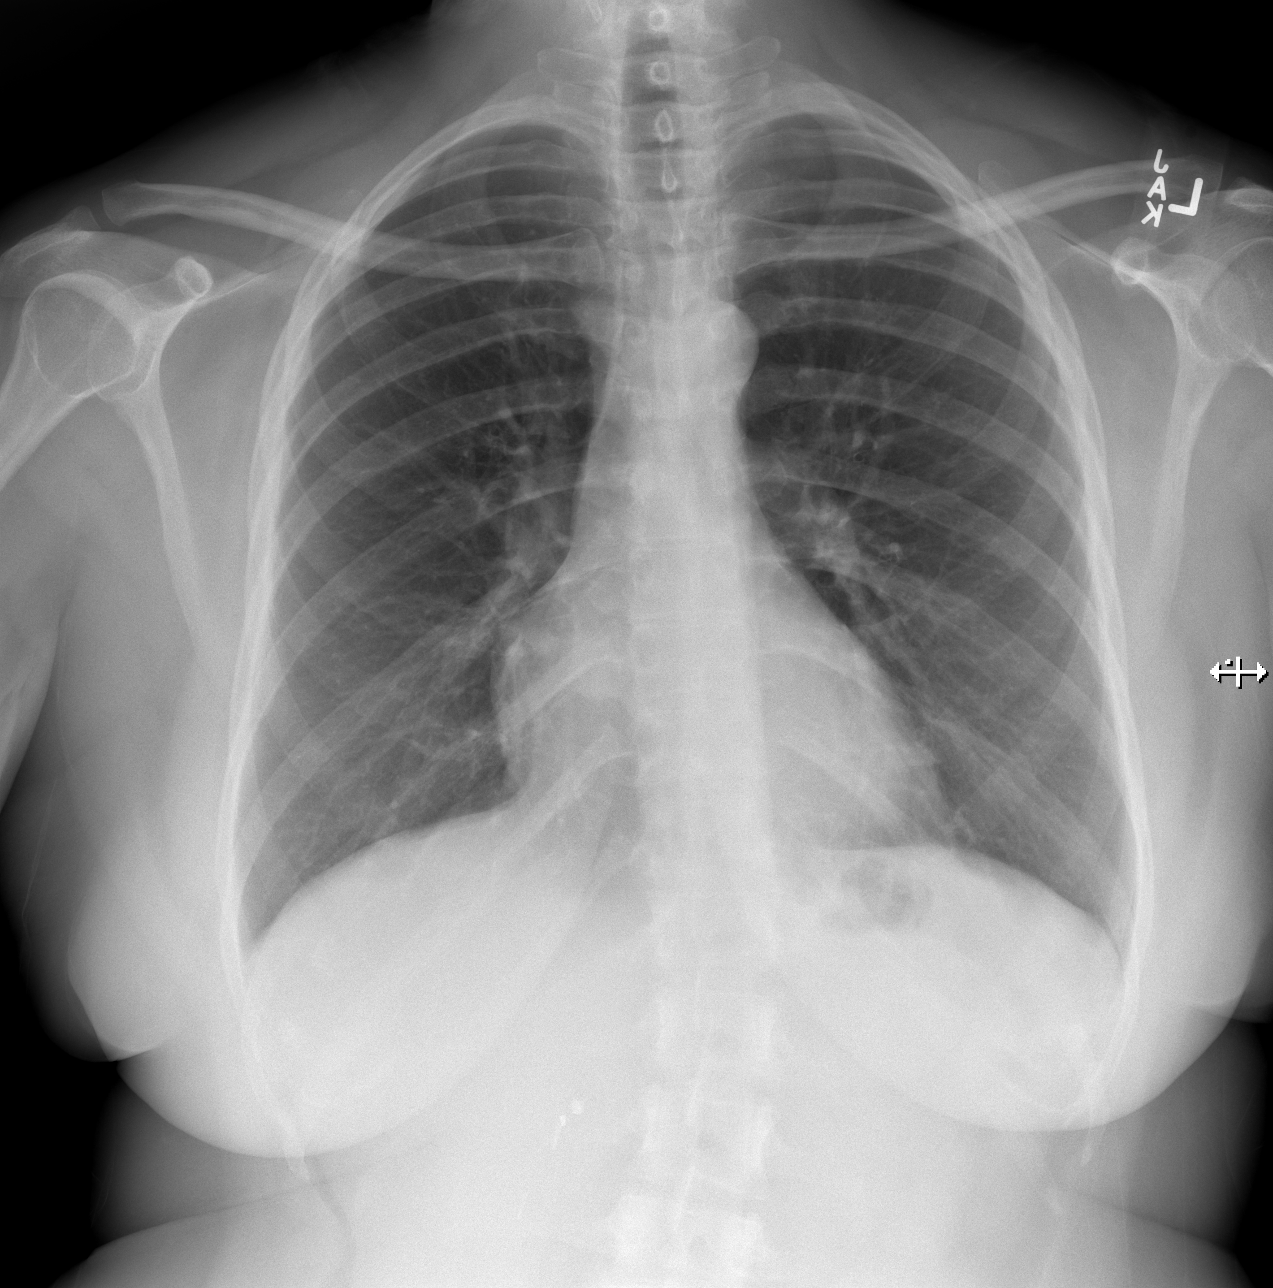

[w ribs ap upper right]
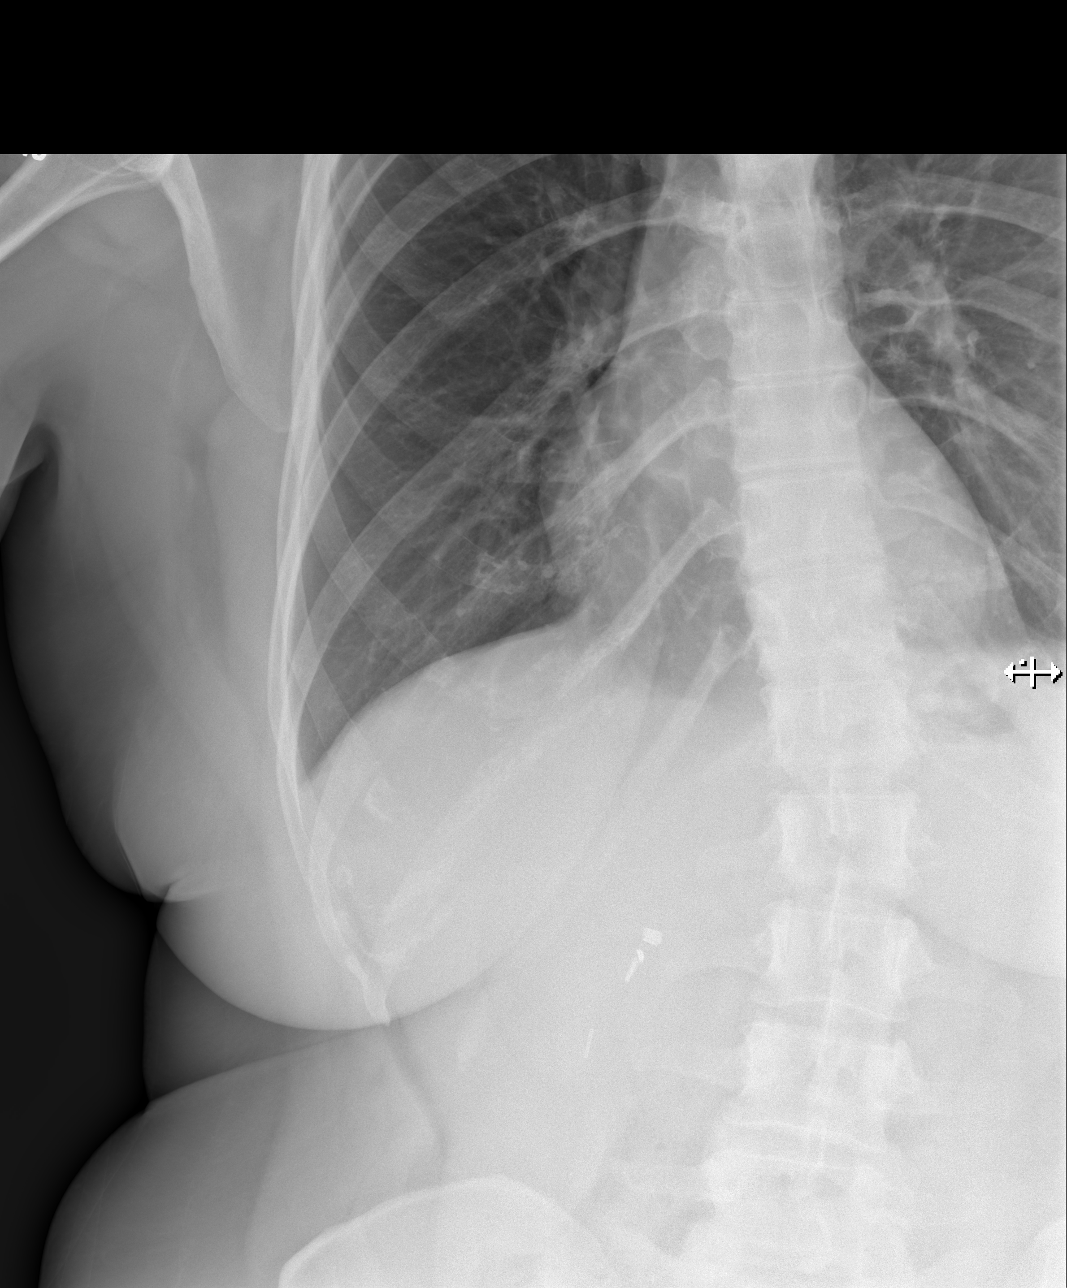

[w ribs obl right]
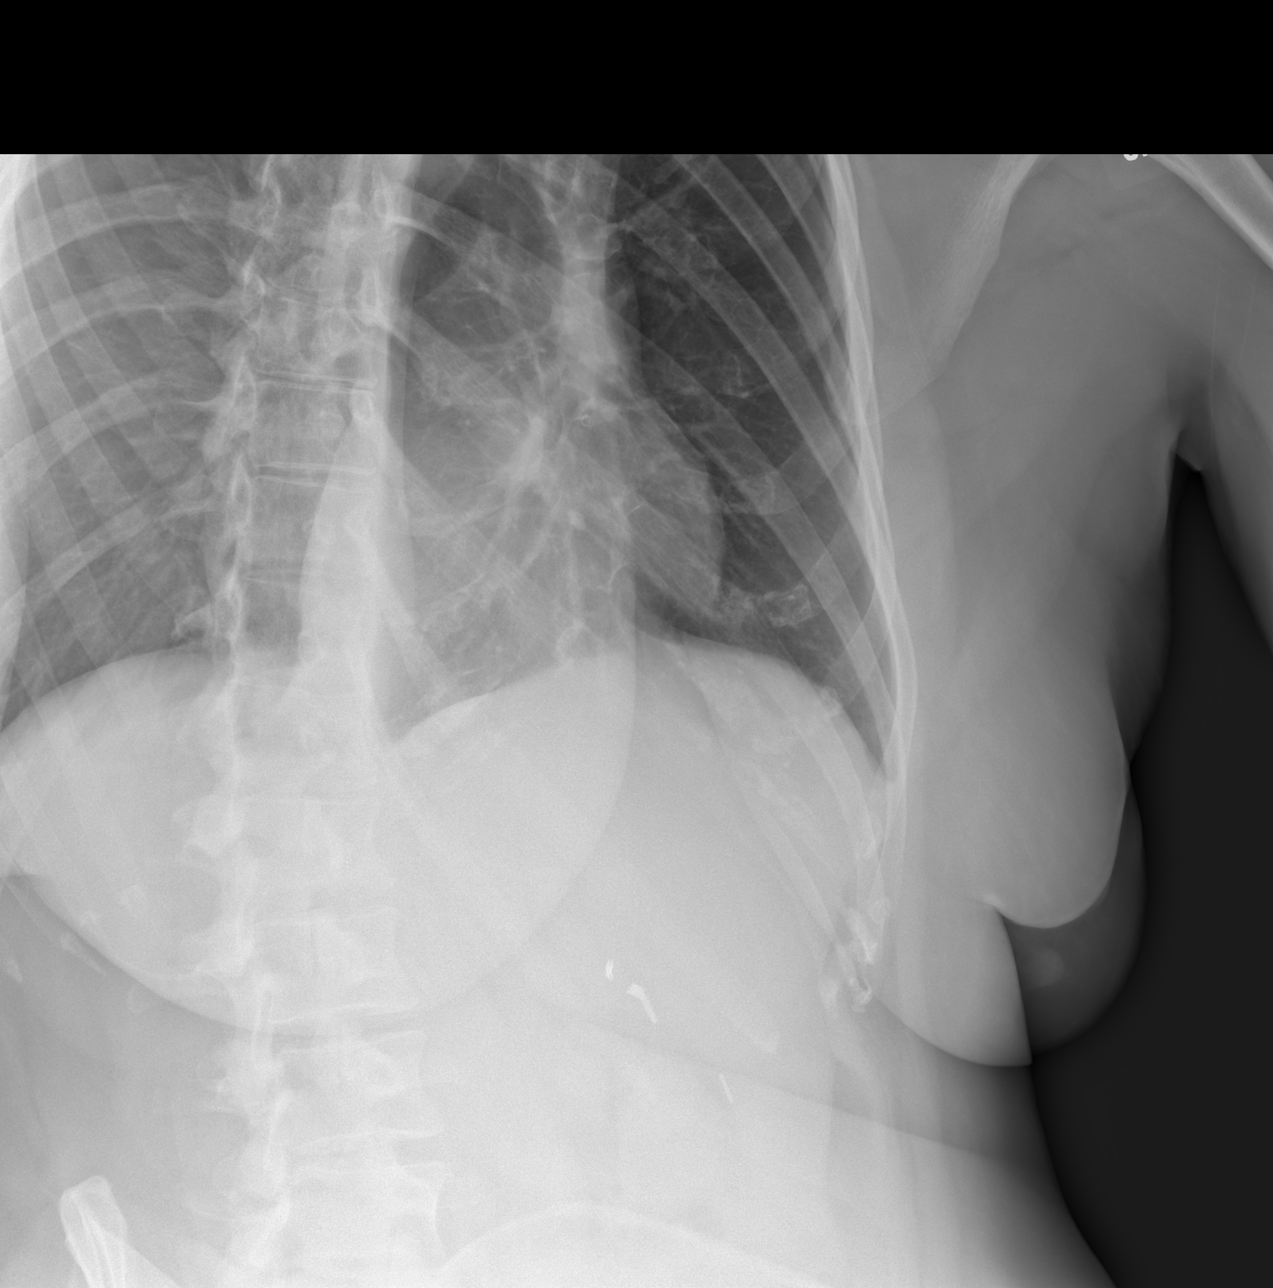

[3 of 3 positions shown; findings below may reference images not displayed]

FINDINGS: Heart size is normal. Lungs are clear. No pneumothorax. Oblique
views of the ribs are unremarkable.
IMPRESSION: Negative.

## 2020-10-23 DIAGNOSIS — N920 Excessive and frequent menstruation with regular cycle: Secondary | ICD-10-CM | POA: Diagnosis not present

## 2020-10-23 DIAGNOSIS — R102 Pelvic and perineal pain: Secondary | ICD-10-CM | POA: Diagnosis not present

## 2020-10-23 DIAGNOSIS — B372 Candidiasis of skin and nail: Secondary | ICD-10-CM | POA: Diagnosis not present

## 2020-10-23 DIAGNOSIS — R109 Unspecified abdominal pain: Secondary | ICD-10-CM | POA: Diagnosis not present

## 2020-11-12 ENCOUNTER — Encounter: Payer: Self-pay | Admitting: Internal Medicine

## 2020-11-13 ENCOUNTER — Other Ambulatory Visit: Payer: Self-pay | Admitting: Obstetrics and Gynecology

## 2020-11-13 DIAGNOSIS — R102 Pelvic and perineal pain: Secondary | ICD-10-CM | POA: Diagnosis not present

## 2020-11-13 DIAGNOSIS — N950 Postmenopausal bleeding: Secondary | ICD-10-CM | POA: Diagnosis not present

## 2020-11-13 DIAGNOSIS — N920 Excessive and frequent menstruation with regular cycle: Secondary | ICD-10-CM | POA: Diagnosis not present

## 2020-11-15 ENCOUNTER — Other Ambulatory Visit: Payer: Self-pay | Admitting: Obstetrics and Gynecology

## 2020-11-22 ENCOUNTER — Encounter: Payer: Self-pay | Admitting: Family Medicine

## 2020-11-22 ENCOUNTER — Other Ambulatory Visit: Payer: Self-pay

## 2020-11-22 ENCOUNTER — Ambulatory Visit: Payer: BC Managed Care – PPO | Admitting: Family Medicine

## 2020-11-22 VITALS — BP 146/100 | HR 91 | Temp 97.1°F | Ht 69.0 in | Wt 224.8 lb

## 2020-11-22 DIAGNOSIS — R1032 Left lower quadrant pain: Secondary | ICD-10-CM | POA: Diagnosis not present

## 2020-11-22 MED ORDER — METRONIDAZOLE 500 MG PO TABS: 500.0000 mg | ORAL_TABLET | Freq: Two times a day (BID) | ORAL | 0 refills | Status: DC

## 2020-11-22 MED ORDER — CIPROFLOXACIN HCL 500 MG PO TABS: 500.0000 mg | ORAL_TABLET | Freq: Two times a day (BID) | ORAL | 0 refills | Status: DC

## 2020-11-22 NOTE — Progress Notes (Signed)
Subjective:    Patient ID: Ashley Wagner, female    DOB: March 13, 1976, 45 y.o.   MRN: [id number]  HPI  Since Monday, the patient has had pain in her left abdomen.  The pain is located in the left mid to left lower quadrant.  It also radiates around to her left mid axillary line area just below her ribs.  She is tender to palpation over her ribs.  She denies any nausea or vomiting.  She denies any diarrhea.  She denies any fever or chills.  She denies any dysuria, urgency, frequency, or hematuria.  She denies any melena or hematochezia.  She does report severe constipation and she is been taking MiraLAX every day but has only had occasional soft poorly formed stool.  She still feels bloated and full.  She saw her gynecologist earlier this week and her pelvic exam and pelvic ultrasound were normal.  There were no ovarian masses or infections.  Past Medical History:  Diagnosis Date  . Abnormal Pap smear 2001   cin1 and cin2 colpo 05/2002  . Chlamydia 2000   treated  . HSV-2 infection   . Hypertension    Past Surgical History:  Procedure Laterality Date  . CESAREAN SECTION  2004   x 1  . CHOLECYSTECTOMY  07/30/15  . DILATATION & CURETTAGE/HYSTEROSCOPY WITH TRUECLEAR N/A 09/07/2014   Procedure: DILATATION & CURETTAGE/HYSTEROSCOPY WITH TRUCLEAR;  Surgeon: Crawford Givens, MD;  Location: [facility];  Service: Gynecology;  Laterality: N/A;  . LEEP  2003  . TOOTH EXTRACTION     Current Outpatient Medications on File Prior to Visit  Medication Sig Dispense Refill  . fluticasone (FLONASE) 50 MCG/ACT nasal spray Place 2 sprays into both nostrils daily. 16 g 6  . hydrochlorothiazide (HYDRODIURIL) 25 MG tablet Take 1 tablet (25 mg total) by mouth daily. 90 tablet 3  . MIBELAS 24 FE 1-20 MG-MCG(24) CHEW CHEW 1 TABLET BY MOUTH DAILY  6  . omeprazole (PRILOSEC) 40 MG capsule Take 40 mg by mouth daily as needed.    . valACYclovir (VALTREX) 500 MG tablet Take 500 mg by mouth daily as needed (outbreaks).     .  naproxen sodium (ANAPROX) 220 MG tablet Take 220 mg by mouth daily as needed (pain). (Patient not taking: Reported on 11/22/2020)     Current Facility-Administered Medications on File Prior to Visit  Medication Dose Route Frequency Provider Last Rate Last Admin  . 0.9 %  sodium chloride infusion  500 mL Intravenous Continuous Irene Shipper, MD       No Known Allergies Social History   Socioeconomic History  . Marital status: Married    Spouse name: Not on file  . Number of children: 1  . Years of education: Not on file  . Highest education level: Not on file  Occupational History  . Occupation: Event organiser  Tobacco Use  . Smoking status: Former Smoker    Types: Cigarettes    Quit date: 08/27/2001    Years since quitting: 19.2  . Smokeless tobacco: Never Used  Substance and Sexual Activity  . Alcohol use: Yes    Alcohol/week: 1.0 standard drink    Types: 1 Glasses of wine per week    Comment: social  . Drug use: No  . Sexual activity: Yes    Partners: Male    Birth control/protection: None  Other Topics Concern  . Not on file  Social History Narrative  . Not on file   Social  Determinants of Health   Financial Resource Strain: Not on file  Food Insecurity: Not on file  Transportation Needs: Not on file  Physical Activity: Not on file  Stress: Not on file  Social Connections: Not on file  Intimate Partner Violence: Not on file      Review of Systems  All other systems reviewed and are negative.      Objective:   Physical Exam Vitals reviewed.  Constitutional:      General: She is not in acute distress.    Appearance: She is well-developed. She is not diaphoretic.  HENT:     Head: Normocephalic and atraumatic.     Right Ear: External ear normal.     Left Ear: External ear normal.     Nose: Nose normal.     Mouth/Throat:     Pharynx: No oropharyngeal exudate.  Eyes:     General: No scleral icterus.       Right eye: No discharge.        Left  eye: No discharge.     Conjunctiva/sclera: Conjunctivae normal.     Pupils: Pupils are equal, round, and reactive to light.  Neck:     Thyroid: No thyromegaly.     Vascular: No JVD.     Trachea: No tracheal deviation.  Cardiovascular:     Rate and Rhythm: Normal rate and regular rhythm.     Heart sounds: Normal heart sounds. No murmur heard. No friction rub.  Pulmonary:     Effort: Pulmonary effort is normal. No respiratory distress.     Breath sounds: Normal breath sounds. No stridor. No wheezing or rales.  Chest:     Chest wall: No tenderness.  Abdominal:     General: Bowel sounds are normal. There is no distension.     Palpations: Abdomen is soft. There is no mass.     Tenderness: There is no abdominal tenderness. There is no guarding or rebound.    Musculoskeletal:        General: No tenderness or deformity. Normal range of motion.     Cervical back: Normal range of motion and neck supple.  Lymphadenopathy:     Cervical: No cervical adenopathy.  Skin:    General: Skin is warm.     Coloration: Skin is not pale.     Findings: No erythema or rash.  Neurological:     Mental Status: She is alert and oriented to person, place, and time.     Cranial Nerves: No cranial nerve deficit.     Motor: No abnormal muscle tone.     Coordination: Coordination normal.     Deep Tendon Reflexes: Reflexes are normal and symmetric.  Psychiatric:        Behavior: Behavior normal.        Thought Content: Thought content normal.        Judgment: Judgment normal.           Assessment & Plan:  LLQ abdominal pain  I believe the pain is most likely either muscular or possibly due to constipation.  I recommended trying Dulcolax as a stimulant laxative to try to have more complete evacuation.  If that does not work she can try magnesium citrate.  Given the location, if she develops fever or worsening abdominal pain, diverticulitis would also be on the differential diagnosis.  I did give her  antibiotics Cipro and Flagyl with strict instructions not to fill unless the pain intensifies in the left lower quadrant or  she develops fever with her current mild pain level.  Recheck Monday or sooner if worse

## 2020-11-26 ENCOUNTER — Other Ambulatory Visit: Payer: Self-pay

## 2020-11-26 ENCOUNTER — Encounter: Payer: Self-pay | Admitting: Family Medicine

## 2020-11-26 ENCOUNTER — Ambulatory Visit (INDEPENDENT_AMBULATORY_CARE_PROVIDER_SITE_OTHER): Payer: BC Managed Care – PPO | Admitting: Family Medicine

## 2020-11-26 VITALS — BP 138/78 | HR 88 | Temp 98.7°F | Resp 14 | Ht 69.0 in | Wt 227.0 lb

## 2020-11-26 DIAGNOSIS — R1032 Left lower quadrant pain: Secondary | ICD-10-CM

## 2020-11-26 MED ORDER — PANTOPRAZOLE SODIUM 40 MG PO TBEC: 40.0000 mg | DELAYED_RELEASE_TABLET | Freq: Every day | ORAL | 3 refills | Status: DC

## 2020-11-26 NOTE — Progress Notes (Signed)
Subjective:    Patient ID: Ashley Wagner, female    DOB: December 20, 1975, 45 y.o.   MRN: [id number]  HPI  11/22/20 Since Monday, the patient has had pain in her left abdomen.  The pain is located in the left mid to left lower quadrant.  It also radiates around to her left mid axillary line area just below her ribs.  She is tender to palpation over her ribs.  She denies any nausea or vomiting.  She denies any diarrhea.  She denies any fever or chills.  She denies any dysuria, urgency, frequency, or hematuria.  She denies any melena or hematochezia.  She does report severe constipation and she is been taking MiraLAX every day but has only had occasional soft poorly formed stool.  She still feels bloated and full.  She saw her gynecologist earlier this week and her pelvic exam and pelvic ultrasound were normal.  There were no ovarian masses or infections.  At that time, my plan was: I believe the pain is most likely either muscular or possibly due to constipation.  I recommended trying Dulcolax as a stimulant laxative to try to have more complete evacuation.  If that does not work she can try magnesium citrate.  Given the location, if she develops fever or worsening abdominal pain, diverticulitis would also be on the differential diagnosis.  I did give her antibiotics Cipro and Flagyl with strict instructions not to fill unless the pain intensifies in the left lower quadrant or she develops fever with her current mild pain level.  Recheck Monday or sooner if worse 11/26/20 Saturday, the patient developed a temperature to 99.1.  She states the abdominal pain did not change but she started taking the Cipro and Flagyl because she became concerned.  She states that the pain is much better today.  Is essentially gone.  However she is now more tender in her left upper quadrant.  She has no tenderness to palpation in the left lower quadrant.  She also reports increasing indigestion and gas and belching.  She states that  she drank some orange juice yesterday and for a brief period of time she developed burning severe left upper quadrant abdominal pain.  Therefore I am questioning the diagnosis.  Past Medical History:  Diagnosis Date  . Abnormal Pap smear 2001   cin1 and cin2 colpo 05/2002  . Chlamydia 2000   treated  . HSV-2 infection   . Hypertension    Past Surgical History:  Procedure Laterality Date  . CESAREAN SECTION  2004   x 1  . CHOLECYSTECTOMY  07/30/15  . DILATATION & CURETTAGE/HYSTEROSCOPY WITH TRUECLEAR N/A 09/07/2014   Procedure: DILATATION & CURETTAGE/HYSTEROSCOPY WITH TRUCLEAR;  Surgeon: Crawford Givens, MD;  Location: [facility];  Service: Gynecology;  Laterality: N/A;  . LEEP  2003  . TOOTH EXTRACTION     Current Outpatient Medications on File Prior to Visit  Medication Sig Dispense Refill  . ciprofloxacin (CIPRO) 500 MG tablet Take 1 tablet (500 mg total) by mouth 2 (two) times daily. 20 tablet 0  . fluticasone (FLONASE) 50 MCG/ACT nasal spray Place 2 sprays into both nostrils daily. 16 g 6  . hydrochlorothiazide (HYDRODIURIL) 25 MG tablet Take 1 tablet (25 mg total) by mouth daily. 90 tablet 3  . metroNIDAZOLE (FLAGYL) 500 MG tablet Take 1 tablet (500 mg total) by mouth 2 (two) times daily. 20 tablet 0  . MIBELAS 24 FE 1-20 MG-MCG(24) CHEW CHEW 1 TABLET BY MOUTH DAILY  6  . naproxen sodium (ANAPROX) 220 MG tablet Take 220 mg by mouth daily as needed (pain).    Marland Kitchen omeprazole (PRILOSEC) 40 MG capsule Take 40 mg by mouth daily as needed.    . valACYclovir (VALTREX) 500 MG tablet Take 500 mg by mouth daily as needed (outbreaks).      Current Facility-Administered Medications on File Prior to Visit  Medication Dose Route Frequency Provider Last Rate Last Admin  . 0.9 %  sodium chloride infusion  500 mL Intravenous Continuous Irene Shipper, MD       No Known Allergies Social History   Socioeconomic History  . Marital status: Married    Spouse name: Not on file  . Number of children: 1   . Years of education: Not on file  . Highest education level: Not on file  Occupational History  . Occupation: Event organiser  Tobacco Use  . Smoking status: Former Smoker    Types: Cigarettes    Quit date: 08/27/2001    Years since quitting: 19.2  . Smokeless tobacco: Never Used  Substance and Sexual Activity  . Alcohol use: Yes    Alcohol/week: 1.0 standard drink    Types: 1 Glasses of wine per week    Comment: social  . Drug use: No  . Sexual activity: Yes    Partners: Male    Birth control/protection: None  Other Topics Concern  . Not on file  Social History Narrative  . Not on file   Social Determinants of Health   Financial Resource Strain: Not on file  Food Insecurity: Not on file  Transportation Needs: Not on file  Physical Activity: Not on file  Stress: Not on file  Social Connections: Not on file  Intimate Partner Violence: Not on file      Review of Systems  All other systems reviewed and are negative.      Objective:   Physical Exam Vitals reviewed.  Constitutional:      General: She is not in acute distress.    Appearance: She is well-developed. She is not diaphoretic.  HENT:     Head: Normocephalic and atraumatic.     Right Ear: External ear normal.     Left Ear: External ear normal.     Nose: Nose normal.     Mouth/Throat:     Pharynx: No oropharyngeal exudate.  Eyes:     General: No scleral icterus.       Right eye: No discharge.        Left eye: No discharge.     Conjunctiva/sclera: Conjunctivae normal.     Pupils: Pupils are equal, round, and reactive to light.  Neck:     Thyroid: No thyromegaly.     Vascular: No JVD.     Trachea: No tracheal deviation.  Cardiovascular:     Rate and Rhythm: Normal rate and regular rhythm.     Heart sounds: Normal heart sounds. No murmur heard. No friction rub.  Pulmonary:     Effort: Pulmonary effort is normal. No respiratory distress.     Breath sounds: Normal breath sounds. No  stridor. No wheezing or rales.  Chest:     Chest wall: No tenderness.  Abdominal:     General: Bowel sounds are normal. There is no distension.     Palpations: Abdomen is soft. There is no mass.     Tenderness: There is no abdominal tenderness. There is no guarding or rebound.    Musculoskeletal:  General: No tenderness or deformity. Normal range of motion.     Cervical back: Normal range of motion and neck supple.  Lymphadenopathy:     Cervical: No cervical adenopathy.  Skin:    General: Skin is warm.     Coloration: Skin is not pale.     Findings: No erythema or rash.  Neurological:     Mental Status: She is alert and oriented to person, place, and time.     Cranial Nerves: No cranial nerve deficit.     Motor: No abnormal muscle tone.     Coordination: Coordination normal.     Deep Tendon Reflexes: Reflexes are normal and symmetric.  Psychiatric:        Behavior: Behavior normal.        Thought Content: Thought content normal.        Judgment: Judgment normal.           Assessment & Plan:  LLQ abdominal pain  Diagnosis is uncertain.  Pain seems to be more left sided and left upper quadrant today and seems to be related to eating acidic food.  She is now endorsing more symptoms that would suggest reflux or gastritis.  I have asked her to stop her antibiotics.  I do not feel that she has diverticulitis.  I suspect more likely gastritis based on her current presentation.  Begin pantoprazole 40 mg a day.  Await response.  Reassess in 1 to 2 weeks.  If symptoms do not improve, would recommend GI consultation for possible EGD and colonoscopy versus CT scan.  If patient develops fever or worsening abdominal pain immediately, would recommend proceeding with a CT scan as soon as possible.  Patient is comfortable with this plan

## 2020-12-25 ENCOUNTER — Ambulatory Visit: Payer: BC Managed Care – PPO | Admitting: Internal Medicine

## 2020-12-25 DIAGNOSIS — Z1231 Encounter for screening mammogram for malignant neoplasm of breast: Secondary | ICD-10-CM | POA: Diagnosis not present

## 2020-12-25 DIAGNOSIS — B009 Herpesviral infection, unspecified: Secondary | ICD-10-CM | POA: Diagnosis not present

## 2020-12-25 DIAGNOSIS — Z124 Encounter for screening for malignant neoplasm of cervix: Secondary | ICD-10-CM | POA: Diagnosis not present

## 2020-12-25 DIAGNOSIS — Z1239 Encounter for other screening for malignant neoplasm of breast: Secondary | ICD-10-CM | POA: Diagnosis not present

## 2020-12-25 DIAGNOSIS — Z01419 Encounter for gynecological examination (general) (routine) without abnormal findings: Secondary | ICD-10-CM | POA: Diagnosis not present

## 2020-12-25 DIAGNOSIS — Z3040 Encounter for surveillance of contraceptives, unspecified: Secondary | ICD-10-CM | POA: Diagnosis not present

## 2021-01-01 NOTE — H&P (Signed)
Ashley Wagner is a 45 y.o. female, P: 1-0-0-1 who presents for permanent sterilization and endometrial ablation because of desire to cease childbearing and menorrhagia.  The patient gives a history of longstanding menorrhagia  described as a 7 day flow with clots requiring the change of a pad every 2 hours.  She also had cramping rated 7/10 but relieved with Tylenol.  In recent years her flow has been managed with oral contraceptives but even with their use she will have unscheduled bleeding. A pelvic ultrasound in March 2022 revealed an anteverted uterus: (8.9 from fundus to external os): 5.20 x 5.50 x 6.31 cm, endometrium: 2.84 mm; # 5 fibroids: anterior sub-serosal-2.65 cm, anterior fundal intramural-2.97 cm, anterior indenting the endometrium-2.55 cm, fundal sub-serosal vs pedunculated-2.90 cm and anterior intramural-2.39 cm; right ovary-1.70 cm and left ovary obscured by bowel gas. An endometrial biopsy at that same time was benign. She denies any dyspareunia, pelvic pain, changes in bowel or bladder function or vaginitis symptoms. After considering both medical and surgical management options for contraception and her menorrhagia,  the patient wants to proceed with surgical management for both in the form of sterilization and endometrial ablation.   Past Medical History  OB History: G: 1;  P: 1-0-0-1; C-section 2004  GYN History: menarche:  45 YO;    LMP: 12/15/2020;  Contraception: Oral Contraceptives;  Has a history of herpes simplex 2 and remote history of gonorrhea.   Has a history of abnormal PAP smear in 2003 treated with LEEP.  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  ROS: Admits to glasses and partial upper dental plate but  denies headache, vision changes, nasal congestion, dysphagia, tinnitus, dizziness, hoarseness, cough,  chest pain, shortness of breath, nausea, vomiting, diarrhea,constipation,  urinary frequency, urgency  dysuria, hematuria, vaginitis symptoms, pelvic pain, swelling of joints,easy bruising,  myalgias, arthralgias, skin rashes, unexplained weight loss and except as is mentioned in the history of present illness, patient's review of systems is otherwise negative.     Physical Exam  Bp:120/78;  P: 75 bpm; R: 20;  Temperature: 98.2 degrees F orally; Weight: 226 lbs.; Height: 5'8"; BMI: 34.4;  O2Sat.: 100 % (room air)  Neck: supple without masses or thyromegaly Lungs: clear to auscultation Heart: regular rate and rhythm Abdomen: soft, non-tender and no organomegaly Pelvic:EGBUS- wnl; vagina-normal rugae; uterus-10-12 weeks size, irregular, cervix without lesions or motion tenderness; adnexae-no tenderness or masses Extremities:  no clubbing, cyanosis or edema   Assesment: Desire for Sterilization                       Menorrhagia                        Uterine Fibroids   Disposition:  A discussion was held with patient regarding the indication for her procedure(s) along with the risks, which include but are not limited to:  reaction to anesthesia, damage to adjacent organs, infection, excessive  bleeding and no change in menstrual flow. The patient verbalized understanding of these risks and has consented to proceed with Laparoscopic Bilateral Salpingo-oophorectomy and Hysteroscopy, Dilatation, Curettage and Hydro-Thermal Endometrial Ablation at [facility] on Jan 09, 2021 at 1 p.m.  CSN# [id number]   Ashley Wagner J. Florene Glen, PA-C  for Dr. Franklyn Wagner. Ashley Wagner

## 2021-01-06 ENCOUNTER — Encounter (HOSPITAL_BASED_OUTPATIENT_CLINIC_OR_DEPARTMENT_OTHER): Payer: Self-pay | Admitting: Obstetrics and Gynecology

## 2021-01-07 ENCOUNTER — Encounter (HOSPITAL_BASED_OUTPATIENT_CLINIC_OR_DEPARTMENT_OTHER): Payer: Self-pay | Admitting: Obstetrics and Gynecology

## 2021-01-07 ENCOUNTER — Other Ambulatory Visit: Payer: Self-pay

## 2021-01-07 NOTE — Progress Notes (Addendum)
ADDENDUM:  Spoke w/ Stacy, OR scheduler for Dr Charlesetta Garibaldi, only needs CBC and d/c CBCdiff (done).   Spoke w/ via phone for pre-op interview--- PT Lab needs dos---- Istat, EKG (per anes)/  Urine preg and either CBC or CBCdiff per Dr Charlesetta Garibaldi  (sent inbox message to Dr Charlesetta Garibaldi in epic to clarify which she wanted, no response yet)              Lab results------ no COVID test -----patient states asymptomatic no test needed Arrive at ------- 1115 on 01-09-2021 NPO after MN NO Solid Food.  Clear liquids from MN until--- 1015 Med rec completed Medications to take morning of surgery ----- Protonix Diabetic medication ----- n/a Patient instructed to bring photo id and insurance card day of surgery Patient aware to have Driver (ride ) / caregiver    for 24 hours after surgery -- husband, Didymus Pettress Patient Special Instructions ----- n/a Pre-Op special Istructions ----- when I received clarification from Dr Charlesetta Garibaldi will do progress note Patient verbalized understanding of instructions that were given at this phone interview. Patient denies shortness of breath, chest pain, fever, cough at this phone interview.

## 2021-01-09 ENCOUNTER — Encounter (HOSPITAL_BASED_OUTPATIENT_CLINIC_OR_DEPARTMENT_OTHER)
Admission: RE | Disposition: A | Discharge status: Home / Self Care | Payer: Self-pay | Source: Home / Self Care | Attending: Obstetrics and Gynecology

## 2021-01-09 ENCOUNTER — Other Ambulatory Visit: Payer: Self-pay

## 2021-01-09 ENCOUNTER — Ambulatory Visit (HOSPITAL_BASED_OUTPATIENT_CLINIC_OR_DEPARTMENT_OTHER): Payer: BC Managed Care – PPO | Admitting: Anesthesiology

## 2021-01-09 ENCOUNTER — Ambulatory Visit (HOSPITAL_BASED_OUTPATIENT_CLINIC_OR_DEPARTMENT_OTHER)
Admission: RE | Admit: 2021-01-09 | Discharge: 2021-01-09 | Disposition: A | Discharge status: Home / Self Care | Payer: BC Managed Care – PPO | Attending: Obstetrics and Gynecology | Admitting: Obstetrics and Gynecology

## 2021-01-09 ENCOUNTER — Encounter (HOSPITAL_BASED_OUTPATIENT_CLINIC_OR_DEPARTMENT_OTHER): Payer: Self-pay | Admitting: Obstetrics and Gynecology

## 2021-01-09 DIAGNOSIS — Z87891 Personal history of nicotine dependence: Secondary | ICD-10-CM | POA: Diagnosis not present

## 2021-01-09 DIAGNOSIS — N920 Excessive and frequent menstruation with regular cycle: Secondary | ICD-10-CM | POA: Diagnosis not present

## 2021-01-09 DIAGNOSIS — Z9889 Other specified postprocedural states: Secondary | ICD-10-CM | POA: Insufficient documentation

## 2021-01-09 DIAGNOSIS — D259 Leiomyoma of uterus, unspecified: Secondary | ICD-10-CM | POA: Diagnosis not present

## 2021-01-09 DIAGNOSIS — D252 Subserosal leiomyoma of uterus: Secondary | ICD-10-CM | POA: Insufficient documentation

## 2021-01-09 DIAGNOSIS — Z302 Encounter for sterilization: Secondary | ICD-10-CM | POA: Diagnosis not present

## 2021-01-09 DIAGNOSIS — Z8619 Personal history of other infectious and parasitic diseases: Secondary | ICD-10-CM | POA: Insufficient documentation

## 2021-01-09 DIAGNOSIS — I10 Essential (primary) hypertension: Secondary | ICD-10-CM | POA: Diagnosis not present

## 2021-01-09 DIAGNOSIS — D251 Intramural leiomyoma of uterus: Secondary | ICD-10-CM | POA: Diagnosis not present

## 2021-01-09 DIAGNOSIS — Z79899 Other long term (current) drug therapy: Secondary | ICD-10-CM | POA: Diagnosis not present

## 2021-01-09 DIAGNOSIS — R600 Localized edema: Secondary | ICD-10-CM | POA: Diagnosis not present

## 2021-01-09 HISTORY — DX: Excessive and frequent menstruation with regular cycle: N92.0

## 2021-01-09 HISTORY — DX: Presence of dental prosthetic device (complete) (partial): Z97.2

## 2021-01-09 HISTORY — DX: Diverticulosis of large intestine without perforation or abscess without bleeding: K57.30

## 2021-01-09 HISTORY — PX: LAPAROSCOPIC BILATERAL SALPINGECTOMY: SHX5889

## 2021-01-09 HISTORY — DX: Leiomyoma of uterus, unspecified: D25.9

## 2021-01-09 HISTORY — PX: DILITATION & CURRETTAGE/HYSTROSCOPY WITH NOVASURE ABLATION: SHX5568

## 2021-01-09 LAB — POCT PREGNANCY, URINE: Preg Test, Ur: NEGATIVE

## 2021-01-09 SURGERY — SALPINGECTOMY, BILATERAL, LAPAROSCOPIC
Anesthesia: General | Site: Vagina

## 2021-01-09 MED ORDER — SCOPOLAMINE 1 MG/3DAYS TD PT72
MEDICATED_PATCH | TRANSDERMAL | Status: DC | PRN
  Administered 2021-01-09: 1 via TRANSDERMAL

## 2021-01-09 MED ORDER — ONDANSETRON HCL 4 MG/2ML IJ SOLN: 4.0000 mg | Freq: Once | INTRAMUSCULAR | Status: DC | PRN

## 2021-01-09 MED ORDER — FENTANYL CITRATE (PF) 250 MCG/5ML IJ SOLN
INTRAMUSCULAR | Status: AC
  Filled 2021-01-09: qty 5

## 2021-01-09 MED ORDER — MIDAZOLAM HCL 2 MG/2ML IJ SOLN
INTRAMUSCULAR | Status: DC | PRN
  Administered 2021-01-09: 2 mg via INTRAVENOUS

## 2021-01-09 MED ORDER — DEXAMETHASONE SODIUM PHOSPHATE 10 MG/ML IJ SOLN
INTRAMUSCULAR | Status: AC
  Filled 2021-01-09: qty 1

## 2021-01-09 MED ORDER — PROPOFOL 10 MG/ML IV BOLUS
INTRAVENOUS | Status: DC | PRN
  Administered 2021-01-09: 100 mg via INTRAVENOUS
  Administered 2021-01-09: 50 mg via INTRAVENOUS

## 2021-01-09 MED ORDER — MIDAZOLAM HCL 2 MG/2ML IJ SOLN
INTRAMUSCULAR | Status: AC
  Filled 2021-01-09: qty 2

## 2021-01-09 MED ORDER — ONDANSETRON HCL 4 MG/2ML IJ SOLN
INTRAMUSCULAR | Status: AC
  Filled 2021-01-09: qty 2

## 2021-01-09 MED ORDER — ACETAMINOPHEN 10 MG/ML IV SOLN
INTRAVENOUS | Status: DC | PRN
  Administered 2021-01-09: 1000 mg via INTRAVENOUS

## 2021-01-09 MED ORDER — LIDOCAINE 2% (20 MG/ML) 5 ML SYRINGE
INTRAMUSCULAR | Status: DC | PRN
  Administered 2021-01-09: 100 mg via INTRAVENOUS

## 2021-01-09 MED ORDER — SODIUM CHLORIDE 0.9 % IR SOLN
Status: DC | PRN
  Administered 2021-01-09 (×2): 3000 mL

## 2021-01-09 MED ORDER — LACTATED RINGERS IV SOLN: INTRAVENOUS | Status: DC

## 2021-01-09 MED ORDER — LACTATED RINGERS IV SOLN: INTRAVENOUS | Status: DC | PRN

## 2021-01-09 MED ORDER — ROCURONIUM BROMIDE 10 MG/ML (PF) SYRINGE
PREFILLED_SYRINGE | INTRAVENOUS | Status: DC | PRN
  Administered 2021-01-09: 60 mg via INTRAVENOUS

## 2021-01-09 MED ORDER — POVIDONE-IODINE 10 % EX SWAB: 2.0000 "application " | Freq: Once | CUTANEOUS | Status: DC

## 2021-01-09 MED ORDER — FENTANYL CITRATE (PF) 100 MCG/2ML IJ SOLN
25.0000 ug | INTRAMUSCULAR | Status: DC | PRN
  Administered 2021-01-09: 25 ug via INTRAVENOUS

## 2021-01-09 MED ORDER — HYDRALAZINE HCL 20 MG/ML IJ SOLN
5.0000 mg | Freq: Once | INTRAMUSCULAR | Status: AC
  Administered 2021-01-09: 5 mg via INTRAVENOUS

## 2021-01-09 MED ORDER — BUPIVACAINE HCL 0.5 % IJ SOLN
INTRAMUSCULAR | Status: DC | PRN
  Administered 2021-01-09: 10 mL

## 2021-01-09 MED ORDER — FENTANYL CITRATE (PF) 100 MCG/2ML IJ SOLN
INTRAMUSCULAR | Status: DC | PRN
  Administered 2021-01-09 (×5): 50 ug via INTRAVENOUS

## 2021-01-09 MED ORDER — KETOROLAC TROMETHAMINE 30 MG/ML IJ SOLN
INTRAMUSCULAR | Status: AC
  Filled 2021-01-09: qty 1

## 2021-01-09 MED ORDER — SUGAMMADEX SODIUM 200 MG/2ML IV SOLN
INTRAVENOUS | Status: DC | PRN
  Administered 2021-01-09: 200 mg via INTRAVENOUS

## 2021-01-09 MED ORDER — ONDANSETRON HCL 4 MG/2ML IJ SOLN
INTRAMUSCULAR | Status: DC | PRN
  Administered 2021-01-09: 4 mg via INTRAVENOUS

## 2021-01-09 MED ORDER — ROCURONIUM BROMIDE 10 MG/ML (PF) SYRINGE
PREFILLED_SYRINGE | INTRAVENOUS | Status: AC
  Filled 2021-01-09: qty 10

## 2021-01-09 MED ORDER — LIDOCAINE HCL 2 % IJ SOLN
INTRAMUSCULAR | Status: DC | PRN
  Administered 2021-01-09: 10 mL

## 2021-01-09 MED ORDER — OXYCODONE HCL 5 MG PO TABS: 5.0000 mg | ORAL_TABLET | Freq: Once | ORAL | Status: DC | PRN

## 2021-01-09 MED ORDER — AMISULPRIDE (ANTIEMETIC) 5 MG/2ML IV SOLN: 10.0000 mg | Freq: Once | INTRAVENOUS | Status: DC | PRN

## 2021-01-09 MED ORDER — OXYCODONE-ACETAMINOPHEN 5-325 MG PO TABS: ORAL_TABLET | ORAL | 0 refills | Status: DC

## 2021-01-09 MED ORDER — SCOPOLAMINE 1 MG/3DAYS TD PT72
MEDICATED_PATCH | TRANSDERMAL | Status: AC
  Filled 2021-01-09: qty 1

## 2021-01-09 MED ORDER — DEXMEDETOMIDINE (PRECEDEX) IN NS 20 MCG/5ML (4 MCG/ML) IV SYRINGE
PREFILLED_SYRINGE | INTRAVENOUS | Status: AC
  Filled 2021-01-09: qty 5

## 2021-01-09 MED ORDER — IBUPROFEN 600 MG PO TABS: ORAL_TABLET | ORAL | 1 refills | Status: DC

## 2021-01-09 MED ORDER — FENTANYL CITRATE (PF) 100 MCG/2ML IJ SOLN
INTRAMUSCULAR | Status: AC
  Filled 2021-01-09: qty 2

## 2021-01-09 MED ORDER — DEXAMETHASONE SODIUM PHOSPHATE 10 MG/ML IJ SOLN
INTRAMUSCULAR | Status: DC | PRN
  Administered 2021-01-09: 10 mg via INTRAVENOUS

## 2021-01-09 MED ORDER — KETOROLAC TROMETHAMINE 30 MG/ML IJ SOLN
INTRAMUSCULAR | Status: DC | PRN
  Administered 2021-01-09: 30 mg via INTRAVENOUS

## 2021-01-09 MED ORDER — ACETAMINOPHEN 10 MG/ML IV SOLN
INTRAVENOUS | Status: AC
  Filled 2021-01-09: qty 100

## 2021-01-09 MED ORDER — OXYCODONE HCL 5 MG/5ML PO SOLN: 5.0000 mg | Freq: Once | ORAL | Status: DC | PRN

## 2021-01-09 MED ORDER — DEXMEDETOMIDINE (PRECEDEX) IN NS 20 MCG/5ML (4 MCG/ML) IV SYRINGE
PREFILLED_SYRINGE | INTRAVENOUS | Status: DC | PRN
  Administered 2021-01-09 (×4): 4 ug via INTRAVENOUS

## 2021-01-09 MED ORDER — LIDOCAINE 2% (20 MG/ML) 5 ML SYRINGE
INTRAMUSCULAR | Status: AC
  Filled 2021-01-09: qty 5

## 2021-01-09 MED ORDER — HYDRALAZINE HCL 20 MG/ML IJ SOLN
INTRAMUSCULAR | Status: AC
  Filled 2021-01-09: qty 1

## 2021-01-09 MED ORDER — PROPOFOL 10 MG/ML IV BOLUS
INTRAVENOUS | Status: AC
  Filled 2021-01-09: qty 20

## 2021-01-09 SURGICAL SUPPLY — 47 items
BARRIER ADHS 3X4 INTERCEED (GAUZE/BANDAGES/DRESSINGS) IMPLANT
CATH ROBINSON RED A/P 16FR (CATHETERS) ×3 IMPLANT
COVER MAYO STAND STRL (DRAPES) ×3 IMPLANT
COVER WAND RF STERILE (DRAPES) ×3 IMPLANT
DERMABOND ADVANCED (GAUZE/BANDAGES/DRESSINGS) ×1
DERMABOND ADVANCED .7 DNX12 (GAUZE/BANDAGES/DRESSINGS) ×2 IMPLANT
DILATOR CANAL MILEX (MISCELLANEOUS) IMPLANT
DRSG OPSITE POSTOP 3X4 (GAUZE/BANDAGES/DRESSINGS) ×3 IMPLANT
DRSG VASELINE 3X18 (GAUZE/BANDAGES/DRESSINGS) IMPLANT
DURAPREP 26ML APPLICATOR (WOUND CARE) ×3 IMPLANT
FORCEPS CUTTING 33CM 5MM (CUTTING FORCEPS) ×3 IMPLANT
FORCEPS CUTTING 45CM 5MM (CUTTING FORCEPS) IMPLANT
GAUZE 4X4 16PLY RFD (DISPOSABLE) ×3 IMPLANT
GLOVE SURG ENC MOIS LTX SZ6.5 (GLOVE) ×3 IMPLANT
GLOVE SURG UNDER POLY LF SZ6.5 (GLOVE) ×6 IMPLANT
GLOVE SURG UNDER POLY LF SZ7 (GLOVE) ×12 IMPLANT
GLOVE SURG UNDER POLY LF SZ7.5 (GLOVE) ×6 IMPLANT
GOWN STRL REUS W/ TWL LRG LVL3 (GOWN DISPOSABLE) ×2 IMPLANT
GOWN STRL REUS W/TWL LRG LVL3 (GOWN DISPOSABLE) ×9 IMPLANT
IV NS IRRIG 3000ML ARTHROMATIC (IV SOLUTION) ×9 IMPLANT
KIT PROCEDURE FLUENT (KITS) ×3 IMPLANT
KIT TURNOVER CYSTO (KITS) ×3 IMPLANT
LIGASURE LAP MARYLAND 5MM 37CM (ELECTROSURGICAL) ×3 IMPLANT
MANIPULATOR UTERINE 4.5 ZUMI (MISCELLANEOUS) IMPLANT
NS IRRIG 1000ML POUR BTL (IV SOLUTION) IMPLANT
NS IRRIG 500ML POUR BTL (IV SOLUTION) ×3 IMPLANT
PACK LAPAROSCOPY BASIN (CUSTOM PROCEDURE TRAY) ×3 IMPLANT
PACK TRENDGUARD 450 HYBRID PRO (MISCELLANEOUS) ×2 IMPLANT
PACK VAGINAL MINOR WOMEN LF (CUSTOM PROCEDURE TRAY) ×3 IMPLANT
PAD OB MATERNITY 4.3X12.25 (PERSONAL CARE ITEMS) ×3 IMPLANT
POUCH SPECIMEN RETRIEVAL 10MM (ENDOMECHANICALS) IMPLANT
PROTECTOR NERVE ULNAR (MISCELLANEOUS) ×6 IMPLANT
SCISSORS LAP 5X35 DISP (ENDOMECHANICALS) IMPLANT
SEAL ROD LENS SCOPE MYOSURE (ABLATOR) ×3 IMPLANT
SET GENESYS HTA PROCERVA (MISCELLANEOUS) ×3 IMPLANT
SET SUCTION IRRIG HYDROSURG (IRRIGATION / IRRIGATOR) ×3 IMPLANT
SET TUBE SMOKE EVAC HIGH FLOW (TUBING) ×3 IMPLANT
SOLUTION ELECTROLUBE (MISCELLANEOUS) ×3 IMPLANT
SUT MNCRL AB 4-0 PS2 18 (SUTURE) ×3 IMPLANT
SUT VICRYL 0 UR6 27IN ABS (SUTURE) ×3 IMPLANT
TOWEL OR 17X26 10 PK STRL BLUE (TOWEL DISPOSABLE) ×3 IMPLANT
TRAY FOLEY W/BAG SLVR 14FR LF (SET/KITS/TRAYS/PACK) ×3 IMPLANT
TRENDGUARD 450 HYBRID PRO PACK (MISCELLANEOUS) ×3
TROCAR BALLN 12MMX100 BLUNT (TROCAR) ×3 IMPLANT
TROCAR BLADELESS OPT 5 100 (ENDOMECHANICALS) ×6 IMPLANT
UNDERPAD 30X36 HEAVY ABSORB (UNDERPADS AND DIAPERS) ×3 IMPLANT
WARMER LAPAROSCOPE (MISCELLANEOUS) ×3 IMPLANT

## 2021-01-09 NOTE — Op Note (Signed)
Preop Diagnosis: MENORRHAGIA DESIRES STERILIZATION   Postop Diagnosis: MENORRHAGIA DESIRES STERILIZATION   Procedure: LAPAROSCOPIC BILATERAL SALPINGECTOMY DILATATION & CURETTAGE/HYSTEROSCOPY WITH NOVASURE ABLATION   Anesthesia: General   Anesthesiologist: Lidia Collum, MD   Attending: Crawford Givens, MD   Assistant:Elmira Florene Glen Findings:  Pathology:none EMBX benign in the office  Fluids:700cc  UOP:510cc  EBL: minimal  Complications: none  Procedure: The patient was taken to the operating room after risks benefits and alternatives were discussed with patient, the patient verbalized understanding and consent signed and witnessed. The patient was placed under general anesthesia and prepped and draped in normal sterile fashion. A bivalve speculum was placed in the patient's vagina and the anterior lip of the cervix was grasped with a single-tooth tenaculum. The cervix was dilated for passage of the hysteroscope. The uterus sounded to 9.  The hysteroscope was introduced into the uterine cavity with findings as noted above. A curettage was performed and currettings sent to pathology. The hysteroscope was reintroduced and hydrothermal ablation was performed without difficulty. The tenaculum was anchored to the acorn and bivalve speculum removed.  Attention was then turned to the abdomen.    . A 2 cm umbilical incision was then performed.and carried down to the fascia.  The fascia was then opened and extended bilaterally.  Peritoneum was then entered.  o vicryl was then placed around the fascia in a circumferential fashion.   The hasson was placed and ancored to the suture.Two 50mm ports were placed under direct visualization.   Normal pelvic anatomy was noted.   Uterus had a few fibroids.  The tubes and ovaries were normal.     The anterior and  Posterior culdesac and liver appeared normal.   Both fallopian tubes were identified and removed using ligasure  The mesosalpinx was irrigated  and was hemostatic.    Following the procedure the umbilical hasson was removed after intra-abdominal carbon dioxide was expressed. The fascia was reaproximated by tying the 0 vicryl suture.   The 47mm skin incisions were closed with dermabond.  The 10 mm incision was closed with a  subcuticular suture of 3-0 monocryl. The intrauterine manipulator was then removed.  The tenaculum site was hemostatic.   Instrument, sponge, and needle counts were correct prior to abdominal closure and at the conclusion of the case.  Findings: See above Estimated Blood Loss:  Minimal         Drains: none         Total IV Fluids:Intravenous fluids were administered, lactated Ringer's 1500 ml's.         Specimens: B tubes        Complications:  None; patient tolerated the procedure well.         Disposition: PACU - hemodynamically stable.         Condition: stable  Earnstine Regal PA assisted the case and was needed.

## 2021-01-09 NOTE — Anesthesia Procedure Notes (Signed)
Procedure Name: Intubation Date/Time: 01/09/2021 1:44 PM Performed by: Mechele Claude, CRNA Pre-anesthesia Checklist: Patient identified, Emergency Drugs available, Suction available and Patient being monitored Patient Re-evaluated:Patient Re-evaluated prior to induction Oxygen Delivery Method: Circle system utilized Preoxygenation: Pre-oxygenation with 100% oxygen Induction Type: Combination inhalational/ intravenous induction Ventilation: Mask ventilation without difficulty Laryngoscope Size: Mac and 3 Grade View: Grade I Tube type: Oral Tube size: 7.0 mm Number of attempts: 1 Airway Equipment and Method: Stylet and Oral airway Placement Confirmation: ETT inserted through vocal cords under direct vision,  positive ETCO2 and breath sounds checked- equal and bilateral Secured at: 22 cm Tube secured with: Tape Dental Injury: Teeth and Oropharynx as per pre-operative assessment

## 2021-01-09 NOTE — Anesthesia Preprocedure Evaluation (Addendum)
Anesthesia Evaluation  Patient identified by MRN, date of birth, ID band Patient awake    Reviewed: Allergy & Precautions, NPO status , Patient's Chart, lab work & pertinent test results  History of Anesthesia Complications Negative for: history of anesthetic complications  Airway Mallampati: II  TM Distance: >3 FB Neck ROM: Full    Dental  (+) Partial Upper, Dental Advisory Given   Pulmonary neg pulmonary ROS, former smoker,    Pulmonary exam normal        Cardiovascular hypertension, Normal cardiovascular exam     Neuro/Psych negative neurological ROS     GI/Hepatic negative GI ROS, Neg liver ROS,   Endo/Other  negative endocrine ROS  Renal/GU negative Renal ROS  negative genitourinary   Musculoskeletal negative musculoskeletal ROS (+)   Abdominal   Peds  Hematology negative hematology ROS (+)   Anesthesia Other Findings   Reproductive/Obstetrics menorrhagia                           Anesthesia Physical Anesthesia Plan  ASA: II  Anesthesia Plan: General   Post-op Pain Management:    Induction: Intravenous  PONV Risk Score and Plan: 4 or greater and Ondansetron, Dexamethasone, Treatment may vary due to age or medical condition, Midazolam and Scopolamine patch - Pre-op  Airway Management Planned: Oral ETT  Additional Equipment: None  Intra-op Plan:   Post-operative Plan: Extubation in OR  Informed Consent: I have reviewed the patients History and Physical, chart, labs and discussed the procedure including the risks, benefits and alternatives for the proposed anesthesia with the patient or authorized representative who has indicated his/her understanding and acceptance.     Dental advisory given  Plan Discussed with:   Anesthesia Plan Comments:        Anesthesia Quick Evaluation

## 2021-01-09 NOTE — Discharge Instructions (Signed)
DISCHARGE INSTRUCTIONS: Laparoscopy  The following instructions have been prepared to help you care for yourself upon your return home today.  Wound care: Marland Kitchen Do not get the incision wet for the first 24 hours. The incision should be kept clean and dry. . The Band-Aids or dressings may be removed the day after surgery. . Should the incision become sore, red, and swollen after the first week, check with your doctor.  Personal hygiene: . Shower the day after your procedure.  Activity and limitations: . Do NOT drive or operate any equipment today. . Do NOT lift anything more than 15 pounds for 2-3 weeks after surgery. . Do NOT rest in bed all day. . Walking is encouraged. Walk each day, starting slowly with 5-minute walks 3 or 4 times a day. Slowly increase the length of your walks. . Walk up and down stairs slowly. . Do NOT do strenuous activities, such as golfing, playing tennis, bowling, running, biking, weight lifting, gardening, mowing, or vacuuming for 2-4 weeks. Ask your doctor when it is okay to start.  Diet: Eat a light meal as desired this evening. You may resume your usual diet tomorrow.  Return to work: This is dependent on the type of work you do. For the most part you can return to a desk job within a week of surgery. If you are more active at work, please discuss this with your doctor.  What to expect after your surgery: You may have a slight burning sensation when you urinate on the first day. You may have a very small amount of blood in the urine. Expect to have a small amount of vaginal discharge/light bleeding for 1-2 weeks. It is not unusual to have abdominal soreness and bruising for up to 2 weeks. You may be tired and need more rest for about 1 week. You may experience shoulder pain for 24-72 hours. Lying flat in bed may relieve it.  Call your doctor for any of the following: . Develop a fever of 100.4 or greater . Inability to urinate 6 hours after discharge from  hospital . Severe pain not relieved by pain medications . Persistent of heavy bleeding at incision site . Redness or swelling around incision site after a week . Increasing nausea or vomiting  DISCHARGE INSTRUCTIONS: D&C / D&E The following instructions have been prepared to help you care for yourself upon your return home.   Personal hygiene: Marland Kitchen Use sanitary pads for vaginal drainage, not tampons. . Shower the day after your procedure. . NO tub baths, pools or Jacuzzis for 2-3 weeks. . Wipe front to back after using the bathroom.  Activity and limitations: . Do NOT drive or operate any equipment for 24 hours. The effects of anesthesia are still present and drowsiness may result. . Do NOT rest in bed all day. . Walking is encouraged. . Walk up and down stairs slowly. . You may resume your normal activity in one to two days or as indicated by your physician.  Sexual activity: NO intercourse for at least 2 weeks after the procedure, or as indicated by your physician.  Diet: Eat a light meal as desired this evening. You may resume your usual diet tomorrow.  Return to work: You may resume your work activities in one to two days or as indicated by your doctor.  What to expect after your surgery: Expect to have vaginal bleeding/discharge for 2-3 days and spotting for up to 10 days. It is not unusual to have soreness for  up to 1-2 weeks. You may have a slight burning sensation when you urinate for the first day. Mild cramps may continue for a couple of days. You may have a regular period in 2-6 weeks.  Call your doctor for any of the following: . Excessive vaginal bleeding, saturating and changing one pad every hour. . Inability to urinate 6 hours after discharge from hospital. . Pain not relieved by pain medication. . Fever of 100.4 F or greater. . Unusual vaginal discharge or odor.  Post Anesthesia Home Care Instructions  Activity: Get plenty of rest for the remainder of the day. A  responsible individual must stay with you for 24 hours following the procedure.  For the next 24 hours, DO NOT: -Drive a car -Paediatric nurse -Drink alcoholic beverages -Take any medication unless instructed by your physician -Make any legal decisions or sign important papers.  Meals: Start with liquid foods such as gelatin or soup. Progress to regular foods as tolerated. Avoid greasy, spicy, heavy foods. If nausea and/or vomiting occur, drink only clear liquids until the nausea and/or vomiting subsides. Call your physician if vomiting continues.  Special Instructions/Symptoms: Your throat may feel dry or sore from the anesthesia or the breathing tube placed in your throat during surgery. If this causes discomfort, gargle with warm salt water. The discomfort should disappear within 24 hours.  If you had a scopolamine patch placed behind your ear for the management of post- operative nausea and/or vomiting:  1. The medication in the patch is effective for 72 hours, after which it should be removed.  Wrap patch in a tissue and discard in the trash. Wash hands thoroughly with soap and water. 2. You may remove the patch earlier than 72 hours if you experience unpleasant side effects which may include dry mouth, dizziness or visual disturbances. 3. Avoid touching the patch. Wash your hands with soap and water after contact with the patch.   Call [facility] @ [phone] if:  You have a temperature greater than or equal to 100.4 degrees Farenheit orally You have pain that is not made better by the pain medication given and taken as directed You have excessive bleeding or problems urinating  Take Colace (Docusate Sodium/Stool Softener) 100 mg 2-3 times daily while taking narcotic pain medicine to avoid constipation or until bowel movements are regular.  You may drive after 24 hours You may walk up steps You may shower tomorrow  You may resume a regular diet Keep incisions  clean and dry  Do not lift over 15 pounds for 6 weeks Avoid anything in vagina for  until after your post-operative visit

## 2021-01-09 NOTE — Transfer of Care (Signed)
Immediate Anesthesia Transfer of Care Note  Patient: Ashley Wagner  Procedure(s) Performed: Procedure(s) (LRB): LAPAROSCOPIC BILATERAL SALPINGECTOMY (Bilateral) HYSTEROSCOPY WITH HYDROTHERMAL ABLATION (N/A)  Patient Location: PACU  Anesthesia Type: General  Level of Consciousness: awake, alert  and oriented  Airway & Oxygen Therapy: Patient Spontanous Breathing and Patient connected to nasal cannula oxygen  Post-op Assessment: Report given to PACU RN and Post -op Vital signs reviewed and stable  Post vital signs: Reviewed and stable  Complications: No apparent anesthesia complications  Last Vitals:  Vitals Value Taken Time  BP 183/112 01/09/21 1533  Temp    Pulse 83 01/09/21 1539  Resp 23 01/09/21 1539  SpO2 100 % 01/09/21 1539  Vitals shown include unvalidated device data.  Last Pain:  Vitals:   01/09/21 1215  TempSrc: Oral  PainSc: 0-No pain      Patients Stated Pain Goal: 4 (64/15/83 0940)  Complications: No complications documented.

## 2021-01-10 ENCOUNTER — Encounter (HOSPITAL_BASED_OUTPATIENT_CLINIC_OR_DEPARTMENT_OTHER): Payer: Self-pay | Admitting: Obstetrics and Gynecology

## 2021-01-13 LAB — SURGICAL PATHOLOGY

## 2021-01-13 NOTE — Anesthesia Postprocedure Evaluation (Signed)
Anesthesia Post Note  Patient: [place] L Starkes  Procedure(s) Performed: LAPAROSCOPIC BILATERAL SALPINGECTOMY (Bilateral Abdomen) HYSTEROSCOPY WITH HYDROTHERMAL ABLATION (N/A Vagina )     Patient location during evaluation: PACU Anesthesia Type: General Level of consciousness: awake and alert Pain management: pain level controlled Vital Signs Assessment: post-procedure vital signs reviewed and stable Respiratory status: spontaneous breathing, nonlabored ventilation and respiratory function stable Cardiovascular status: blood pressure returned to baseline and stable Postop Assessment: no apparent nausea or vomiting Anesthetic complications: no   No complications documented.  Last Vitals:  Vitals:   01/09/21 1630 01/09/21 1654  BP: (!) 163/99 (!) 177/98  Pulse:  82  Resp:  20  Temp:  37.1 C  SpO2:  98%    Last Pain:  Vitals:   01/09/21 1654  TempSrc:   PainSc: 3                  Lidia Collum

## 2021-02-17 ENCOUNTER — Other Ambulatory Visit: Payer: Self-pay

## 2021-02-17 ENCOUNTER — Encounter: Payer: Self-pay | Admitting: Internal Medicine

## 2021-02-17 ENCOUNTER — Ambulatory Visit (INDEPENDENT_AMBULATORY_CARE_PROVIDER_SITE_OTHER): Payer: BC Managed Care – PPO | Admitting: Internal Medicine

## 2021-02-17 VITALS — BP 140/78 | HR 68 | Ht 67.0 in | Wt 227.4 lb

## 2021-02-17 DIAGNOSIS — K5900 Constipation, unspecified: Secondary | ICD-10-CM | POA: Diagnosis not present

## 2021-02-17 DIAGNOSIS — R1012 Left upper quadrant pain: Secondary | ICD-10-CM

## 2021-02-17 DIAGNOSIS — Z86010 Personal history of colonic polyps: Secondary | ICD-10-CM | POA: Diagnosis not present

## 2021-02-17 MED ORDER — PANTOPRAZOLE SODIUM 40 MG PO TBEC: 40.0000 mg | DELAYED_RELEASE_TABLET | Freq: Every day | ORAL | 6 refills | Status: DC

## 2021-02-17 NOTE — Patient Instructions (Signed)
If you are age 46 or older, your body mass index should be between 23-30. Your Body mass index is 35.62 kg/m. If this is out of the aforementioned range listed, please consider follow up with your Primary Care Provider.  If you are age 46 or younger, your body mass index should be between 19-25. Your Body mass index is 35.62 kg/m. If this is out of the aformentioned range listed, please consider follow up with your Primary Care Provider.   __________________________________________________________  The [facility] providers would like to encourage you to use [facility] to communicate with providers for non-urgent requests or questions.  Due to long hold times on the telephone, sending your provider a message by [facility] may be a faster and more efficient way to get a response.  Please allow 48 business hours for a response.  Please remember that this is for non-urgent requests.   We have sent the following medications to your pharmacy for you to pick up at your convenience:  Pantoprazole  I will call you in the next week or so to schedule your endo/colon.

## 2021-02-19 ENCOUNTER — Encounter: Payer: Self-pay | Admitting: Internal Medicine

## 2021-02-19 NOTE — Progress Notes (Signed)
HISTORY OF PRESENT ILLNESS:  Ashley Wagner is a 45 y.o. female, group Sales promotion account executive at [employer], who is self-referred today regarding problems with constipation, left upper quadrant pain, and a history of colon polyps.  Patient was last seen in this office October 2017 regarding chronic right-sided musculoskeletal type pain, chronic GERD, constipation, and the need for screening colonoscopy.  There is a family history of colon cancer in her father.  See that dictation.  She subsequently underwent colonoscopy and upper endoscopy June 29, 2016.  Colonoscopy revealed right and left-sided diverticulosis.  As well 2 diminutive colon polyps which were removed and found to be tubular adenoma and sessile serrated polyp.  Follow-up in 5 years recommended.  Upper endoscopy was normal.  The patient's current history dates back to March 2022 when she reports problems with constipation.  This was treated with a combination of MiraLAX and Colace.  She subsequently developed left-sided pain.  She notified her PCP who empirically prescribed antibiotics for the possibility of diverticulitis.  She was subsequently seen thereafter for ongoing discomfort.  Pain was felt to possibly represent peptic ulcer for which she was placed on pantoprazole.  Patient seems to think that this helps her discomfort.  She is status post cholecystectomy.  Patient's GI review of systems is otherwise negative.  REVIEW OF SYSTEMS:  All non-GI ROS is otherwise entirely negative  Past Medical History:  Diagnosis Date   Diverticulosis of colon    History of cervical dysplasia 2003   CIN I and II  s/p LEEP   History of chlamydia 2000   treated   HSV-2 infection    Hypertension    Menorrhagia    Uterine fibroid    Wears partial dentures    upper    Past Surgical History:  Procedure Laterality Date   CESAREAN SECTION  2004   COLONOSCOPY WITH ESOPHAGOGASTRODUODENOSCOPY (EGD)  2017  dr Henrene Pastor   DILATATION &  CURETTAGE/HYSTEROSCOPY WITH TRUECLEAR N/A 09/07/2014   Procedure: DILATATION & CURETTAGE/HYSTEROSCOPY WITH TRUCLEAR;  Surgeon: Crawford Givens, MD;  Location: [facility];  Service: Gynecology;  Laterality: N/A;   DILITATION & CURRETTAGE/HYSTROSCOPY WITH NOVASURE ABLATION N/A 01/09/2021   Procedure: HYSTEROSCOPY WITH HYDROTHERMAL ABLATION;  Surgeon: Crawford Givens, MD;  Location: [facility];  Service: Gynecology;  Laterality: N/A;   LAPAROSCOPIC BILATERAL SALPINGECTOMY Bilateral 01/09/2021   Procedure: LAPAROSCOPIC BILATERAL SALPINGECTOMY;  Surgeon: Crawford Givens, MD;  Location: [facility];  Service: Gynecology;  Laterality: Bilateral;   LAPAROSCOPIC CHOLECYSTECTOMY  07/30/15   LEEP  2003    Social History Ashley Wagner  reports that she quit smoking about 19 years ago. Her smoking use included cigarettes. She has never used smokeless tobacco. She reports current alcohol use. She reports that she does not use drugs.  family history includes Arthritis in her father and sister; Cancer (age of onset: 59) in her father; Colon cancer in her paternal grandmother; Colon cancer (age of onset: 103) in her father; Colon polyps in her father; Depression in her brother and mother; Drug abuse in her brother; Hypertension in her brother and father.  No Known Allergies     PHYSICAL EXAMINATION: Vital signs: BP 140/78   Pulse 68   Ht 5\' 7"  (1.702 m)   Wt 227 lb 6.4 oz (103.1 kg)   BMI 35.62 kg/m   Constitutional: generally well-appearing, no acute distress Psychiatric: alert and oriented x3, cooperative Eyes: extraocular movements intact, anicteric, conjunctiva pink Mouth: oral pharynx moist, no lesions  Neck: supple no lymphadenopathy Cardiovascular: heart regular rate and rhythm, no murmur Lungs: clear to auscultation bilaterally Abdomen: soft, nontender, nondistended, no obvious ascites, no peritoneal signs, normal bowel sounds, no organomegaly Rectal: Deferred until  colonoscopy Extremities: no clubbing, cyanosis, or lower extremity edema bilaterally Skin: no lesions on visible extremities Neuro: No focal deficits.  Cranial nerves intact  ASSESSMENT:  1.  Functional constipation.  Improved with MiraLAX and Colace 2.  Quadrant pain.  Etiology unclear.  Seemingly improved with PPI. 3.  Personal history of adenomatous colon polyp and sessile serrated polyp 2017 4.  Family history of colon cancer in father 2.  Diverticulosis 6.  GERD.  Now on pantoprazole.  She request medication refill 7.  Status postcholecystectomy   PLAN:  1.  Continue MiraLAX and Colace to achieve regular bowel movements. 2.  Reflux precautions 3.  Refill pantoprazole 40 mg daily.  Medication risks reviewed 4.  Schedule upper endoscopy to evaluate left upper quadrant pain.The nature of the procedure, as well as the risks, benefits, and alternatives were carefully and thoroughly reviewed with the patient. Ample time for discussion and questions allowed. The patient understood, was satisfied, and agreed to proceed.  5.  Schedule colonoscopy for colorectal neoplasia surveillance.The nature of the procedure, as well as the risks, benefits, and alternatives were carefully and thoroughly reviewed with the patient. Ample time for discussion and questions allowed. The patient understood, was satisfied, and agreed to proceed.   A total time of 45 minutes was spent preparing to see the patient, obtaining history, performing medically appropriate physical exam, counseling the patient regarding her multiple above listed issues, ordering medications and advanced endoscopic procedures.  Finally, documenting clinical information in the health record

## 2021-06-06 ENCOUNTER — Other Ambulatory Visit: Payer: Self-pay

## 2021-06-06 ENCOUNTER — Other Ambulatory Visit: Payer: Self-pay | Admitting: Internal Medicine

## 2021-06-06 ENCOUNTER — Ambulatory Visit (AMBULATORY_SURGERY_CENTER): Payer: BC Managed Care – PPO

## 2021-06-06 VITALS — Ht 69.0 in | Wt 219.0 lb

## 2021-06-06 DIAGNOSIS — Z800 Family history of malignant neoplasm of digestive organs: Secondary | ICD-10-CM

## 2021-06-06 DIAGNOSIS — Z1211 Encounter for screening for malignant neoplasm of colon: Secondary | ICD-10-CM

## 2021-06-06 DIAGNOSIS — K219 Gastro-esophageal reflux disease without esophagitis: Secondary | ICD-10-CM

## 2021-06-06 MED ORDER — PEG-KCL-NACL-NASULF-NA ASC-C 100 G PO SOLR: 1.0000 | Freq: Once | ORAL | 0 refills | Status: AC

## 2021-06-06 NOTE — Progress Notes (Signed)

## 2021-06-18 ENCOUNTER — Telehealth: Payer: Self-pay | Admitting: Internal Medicine

## 2021-06-18 DIAGNOSIS — K219 Gastro-esophageal reflux disease without esophagitis: Secondary | ICD-10-CM

## 2021-06-18 DIAGNOSIS — Z800 Family history of malignant neoplasm of digestive organs: Secondary | ICD-10-CM

## 2021-06-18 MED ORDER — NA SULFATE-K SULFATE-MG SULF 17.5-3.13-1.6 GM/177ML PO SOLN: 1.0000 | Freq: Once | ORAL | 0 refills | Status: AC

## 2021-06-18 NOTE — Telephone Encounter (Signed)
Inbound call from pt requesting a call back stating that her prep is not covered under insurance and would like an alternative. Please advise. Thank you.

## 2021-06-18 NOTE — Telephone Encounter (Signed)
Please assist

## 2021-06-18 NOTE — Telephone Encounter (Signed)
Per [facility] Generic Suprep $  0 co pay --  [facility] will be [facility] tomorrow- pt aware -  sent new instructions via e mail [email or web address] - we discussed prep over the phone

## 2021-06-20 ENCOUNTER — Ambulatory Visit (AMBULATORY_SURGERY_CENTER): Payer: BC Managed Care – PPO | Admitting: Internal Medicine

## 2021-06-20 ENCOUNTER — Encounter: Payer: Self-pay | Admitting: Internal Medicine

## 2021-06-20 VITALS — BP 156/96 | HR 80 | Temp 98.6°F | Resp 22 | Ht 69.0 in | Wt 219.0 lb

## 2021-06-20 DIAGNOSIS — Z800 Family history of malignant neoplasm of digestive organs: Secondary | ICD-10-CM | POA: Diagnosis not present

## 2021-06-20 DIAGNOSIS — Z1211 Encounter for screening for malignant neoplasm of colon: Secondary | ICD-10-CM

## 2021-06-20 DIAGNOSIS — Z86010 Personal history of colonic polyps: Secondary | ICD-10-CM | POA: Diagnosis not present

## 2021-06-20 DIAGNOSIS — R1012 Left upper quadrant pain: Secondary | ICD-10-CM

## 2021-06-20 DIAGNOSIS — K219 Gastro-esophageal reflux disease without esophagitis: Secondary | ICD-10-CM | POA: Diagnosis not present

## 2021-06-20 MED ORDER — SODIUM CHLORIDE 0.9 % IV SOLN: 500.0000 mL | Freq: Once | INTRAVENOUS | Status: DC

## 2021-06-20 NOTE — Op Note (Signed)
[facility] Patient Name: Ashley Wagner Procedure Date: 06/20/2021 12:00 PM MRN: [id number] Endoscopist: Docia Chuck. Henrene Pastor , MD Age: 45 Referring MD:  Date of Birth: [birth year] Gender: Female Account #: [id number] Procedure:                Upper GI endoscopy Indications:              Abdominal pain in the left upper quadrant (improved                            since office evaluation), Esophageal reflux Medicines:                Monitored Anesthesia Care Procedure:                Pre-Anesthesia Assessment:                           - Prior to the procedure, a History and Physical                            was performed, and patient medications and                            allergies were reviewed. The patient's tolerance of                            previous anesthesia was also reviewed. The risks                            and benefits of the procedure and the sedation                            options and risks were discussed with the patient.                            All questions were answered, and informed consent                            was obtained. Prior Anticoagulants: The patient has                            taken no previous anticoagulant or antiplatelet                            agents. ASA Grade Assessment: II - A patient with                            mild systemic disease. After reviewing the risks                            and benefits, the patient was deemed in                            satisfactory condition to undergo the procedure.  After obtaining informed consent, the endoscope was                            passed under direct vision. Throughout the                            procedure, the patient's blood pressure, pulse, and                            oxygen saturations were monitored continuously. The                            Endoscope was introduced through the mouth, and                            advanced to  the second part of duodenum. The upper                            GI endoscopy was accomplished without difficulty.                            The patient tolerated the procedure well. Scope In: Scope Out: Findings:                 The esophagus was normal.                           The stomach was normal, save small hiatal hernia.                           The examined duodenum was normal.                           The cardia and gastric fundus were normal on                            retroflexion. Complications:            No immediate complications. Estimated Blood Loss:     Estimated blood loss: none. Impression:               1. Normal EGD                           2. GERD. Recommendation:           - Patient has a contact number available for                            emergencies. The signs and symptoms of potential                            delayed complications were discussed with the                            patient. Return to normal activities tomorrow.  Written discharge instructions were provided to the                            patient.                           - Resume previous diet.                           - Continue present medications. Docia Chuck. Henrene Pastor, MD 06/20/2021 12:39:07 PM This report has been signed electronically.

## 2021-06-20 NOTE — Progress Notes (Signed)
Quamesha Mullet CRNA relieves [employer]

## 2021-06-20 NOTE — Op Note (Signed)
[facility] Patient Name: Ashley Wagner Procedure Date: 06/20/2021 12:01 PM MRN: [id number] Endoscopist: Docia Chuck. Henrene Pastor , MD Age: 45 Referring MD:  Date of Birth: [birth year] Gender: Female Account #: [id number] Procedure:                Colonoscopy Indications:              High risk colon cancer surveillance: Personal                            history of non-advanced adenoma, High risk colon                            cancer surveillance: Personal history of sessile                            serrated colon polyp (less than 10 mm in size) with                            no dysplasia. Previous examination 2017. Father                            with history of colon cancer around age 44 Medicines:                Monitored Anesthesia Care Procedure:                Pre-Anesthesia Assessment:                           - Prior to the procedure, a History and Physical                            was performed, and patient medications and                            allergies were reviewed. The patient's tolerance of                            previous anesthesia was also reviewed. The risks                            and benefits of the procedure and the sedation                            options and risks were discussed with the patient.                            All questions were answered, and informed consent                            was obtained. Prior Anticoagulants: The patient has                            taken no previous anticoagulant or antiplatelet  agents. ASA Grade Assessment: II - A patient with                            mild systemic disease. After reviewing the risks                            and benefits, the patient was deemed in                            satisfactory condition to undergo the procedure.                           After obtaining informed consent, the colonoscope                            was passed under direct  vision. Throughout the                            procedure, the patient's blood pressure, pulse, and                            oxygen saturations were monitored continuously. The                            CF [id number] #[id number] was introduced through the anus                            and advanced to the the cecum, identified by                            appendiceal orifice and ileocecal valve. The                            ileocecal valve, appendiceal orifice, and rectum                            were photographed. The quality of the bowel                            preparation was excellent. The colonoscopy was                            performed without difficulty. The patient tolerated                            the procedure well. The bowel preparation used was                            SUPREP via split dose instruction. Scope In: 12:13:54 PM Scope Out: 12:27:19 PM Scope Withdrawal Time: 0 hours 9 minutes 11 seconds  Total Procedure Duration: 0 hours 13 minutes 25 seconds  Findings:                 Multiple diverticula were found in the left colon  and right colon.                           Internal hemorrhoids were found during retroflexion.                           The exam was otherwise without abnormality on                            direct and retroflexion views. Complications:            No immediate complications. Estimated blood loss:                            None. Estimated Blood Loss:     Estimated blood loss: none. Impression:               - Diverticulosis in the left colon and in the right                            colon.                           - Internal hemorrhoids.                           - The examination was otherwise normal on direct                            and retroflexion views.                           - No specimens collected. Recommendation:           - Repeat colonoscopy in 5 years for surveillance                             (family history).                           - Patient has a contact number available for                            emergencies. The signs and symptoms of potential                            delayed complications were discussed with the                            patient. Return to normal activities tomorrow.                            Written discharge instructions were provided to the                            patient.                           -  Resume previous diet.                           - Continue present medications. Docia Chuck. Henrene Pastor, MD 06/20/2021 12:33:11 PM This report has been signed electronically.

## 2021-06-20 NOTE — Progress Notes (Signed)
HISTORY OF PRESENT ILLNESS:  Ashley Wagner is a 45 y.o. female patient presents today for surveillance colonoscopy and upper endoscopy to evaluate transient left upper quadrant discomfort and chronic GERD.  She was seen in the office in June.  No interval changes  REVIEW OF SYSTEMS:  All non-GI ROS negative. Past Medical History:  Diagnosis Date   Diverticulosis of colon    GERD (gastroesophageal reflux disease)    on meds- and with certain foods   History of cervical dysplasia 2003   CIN I and II  s/p LEEP   History of chlamydia 2000   treated   HSV-2 infection    Hypertension    on meds   Menorrhagia    Uterine fibroid    Wears partial dentures    upper    Past Surgical History:  Procedure Laterality Date   CESAREAN SECTION  2004   COLONOSCOPY WITH ESOPHAGOGASTRODUODENOSCOPY (EGD)  2017  dr Henrene Pastor   DILATATION & CURETTAGE/HYSTEROSCOPY WITH TRUECLEAR N/A 09/07/2014   Procedure: DILATATION & CURETTAGE/HYSTEROSCOPY WITH TRUCLEAR;  Surgeon: Crawford Givens, MD;  Location: [facility];  Service: Gynecology;  Laterality: N/A;   DILITATION & CURRETTAGE/HYSTROSCOPY WITH NOVASURE ABLATION N/A 01/09/2021   Procedure: HYSTEROSCOPY WITH HYDROTHERMAL ABLATION;  Surgeon: Crawford Givens, MD;  Location: [facility];  Service: Gynecology;  Laterality: N/A;   LAPAROSCOPIC BILATERAL SALPINGECTOMY Bilateral 01/09/2021   Procedure: LAPAROSCOPIC BILATERAL SALPINGECTOMY;  Surgeon: Crawford Givens, MD;  Location: [facility];  Service: Gynecology;  Laterality: Bilateral;   LAPAROSCOPIC CHOLECYSTECTOMY  07/30/15   LEEP  2003    Social History Ashley Wagner  reports that she quit smoking about 19 years ago. Her smoking use included cigarettes. She has never used smokeless tobacco. She reports current alcohol use of about 1.0 standard drink per week. She reports that she does not use drugs.  family history includes Arthritis in her father and sister; Cancer (age of onset: 43) in  her father; Colon cancer in her paternal grandmother; Colon cancer (age of onset: 16) in her father; Colon polyps in her mother and paternal grandmother; Colon polyps (age of onset: 74) in her father; Depression in her brother and mother; Drug abuse in her brother; Hypertension in her brother and father.  No Known Allergies     PHYSICAL EXAMINATION:  Vital signs: BP (!) 173/141   Pulse 89   Temp 98.6 F (37 C) (Skin)   Resp 14   Ht 5\' 9"  (1.753 m)   Wt 219 lb (99.3 kg)   SpO2 100%   BMI 32.34 kg/m  General: Well-developed, well-nourished, no acute distress HEENT: Sclerae are anicteric, conjunctiva pink. Oral mucosa intact Lungs: Clear Heart: Regular Abdomen: soft, nontender, nondistended, no obvious ascites, no peritoneal signs, normal bowel sounds. No organomegaly. Extremities: No edema Psychiatric: alert and oriented x3. Cooperative     ASSESSMENT:  1.  Family history of colon cancer 2.  Personal history of adenomatous colon polyps and sessile serrated polyps 3.  GERD 4.  Transient left upper quadrant pain   PLAN:   1.  Colonoscopy and upper endoscopy

## 2021-06-20 NOTE — Progress Notes (Signed)
Sedate, gd SR, tolerated procedure well, VSS, report to RN 

## 2021-06-20 NOTE — Patient Instructions (Signed)
Please read handouts provided. Continue present medications. Repeat colonoscopy in 5 years for screening.   YOU HAD AN ENDOSCOPIC PROCEDURE TODAY AT [facility]:   Refer to the procedure report that was given to you for any specific questions about what was found during the examination.  If the procedure report does not answer your questions, please call your gastroenterologist to clarify.  If you requested that your care partner not be given the details of your procedure findings, then the procedure report has been included in a sealed envelope for you to review at your convenience later.  YOU SHOULD EXPECT: Some feelings of bloating in the abdomen. Passage of more gas than usual.  Walking can help get rid of the air that was put into your GI tract during the procedure and reduce the bloating. If you had a lower endoscopy (such as a colonoscopy or flexible sigmoidoscopy) you may notice spotting of blood in your stool or on the toilet paper. If you underwent a bowel prep for your procedure, you may not have a normal bowel movement for a few days.  Please Note:  You might notice some irritation and congestion in your nose or some drainage.  This is from the oxygen used during your procedure.  There is no need for concern and it should clear up in a day or so.  SYMPTOMS TO REPORT IMMEDIATELY:  Following lower endoscopy (colonoscopy or flexible sigmoidoscopy):  Excessive amounts of blood in the stool  Significant tenderness or worsening of abdominal pains  Swelling of the abdomen that is new, acute  Fever of 100F or higher  Following upper endoscopy (EGD)  Vomiting of blood or coffee ground material  New chest pain or pain under the shoulder blades  Painful or persistently difficult swallowing  New shortness of breath  Fever of 100F or higher  Black, tarry-looking stools  For urgent or emergent issues, a gastroenterologist can be reached at any hour by calling [phone]  [phone]. Do not use MyChart messaging for urgent concerns.    DIET:  We do recommend a small meal at first, but then you may proceed to your regular diet.  Drink plenty of fluids but you should avoid alcoholic beverages for 24 hours.  ACTIVITY:  You should plan to take it easy for the rest of today and you should NOT DRIVE or use heavy machinery until tomorrow (because of the sedation medicines used during the test).    FOLLOW UP: Our staff will call the number listed on your records 48-72 hours following your procedure to check on you and address any questions or concerns that you may have regarding the information given to you following your procedure. If we do not reach you, we will leave a message.  We will attempt to reach you two times.  During this call, we will ask if you have developed any symptoms of COVID 19. If you develop any symptoms (ie: fever, flu-like symptoms, shortness of breath, cough etc.) before then, please call [phone].  If you test positive for Covid 19 in the 2 weeks post procedure, please call and report this information to [place].    If any biopsies were taken you will be contacted by phone or by letter within the next 1-3 weeks.  Please call us at [phone] if you have not heard about the biopsies in 3 weeks.    SIGNATURES/CONFIDENTIALITY: You and/or your care partner have signed paperwork which will be entered into your electronic medical  record.  These signatures attest to the fact that that the information above on your After Visit Summary has been reviewed and is understood.  Full responsibility of the confidentiality of this discharge information lies with you and/or your care-partner.

## 2021-06-20 NOTE — Progress Notes (Signed)
Pt's states no medical or surgical changes since previsit or office visit. VS assessed by D.T 

## 2021-06-24 ENCOUNTER — Telehealth: Payer: Self-pay | Admitting: *Deleted

## 2021-06-24 NOTE — Telephone Encounter (Signed)
  Follow up Call-  Call back number 06/20/2021  Post procedure Call Back phone  # [phone]  Permission to leave phone message Yes  Some recent data might be hidden     Patient questions:  Do you have a fever, pain , or abdominal swelling? No. Pain Score  0 *  Have you tolerated food without any problems? Yes.    Have you been able to return to your normal activities? Yes.    Do you have any questions about your discharge instructions: Diet   no Medications  no Follow up visit  No.  Do you have questions or concerns about your Care? No.  Actions: * If pain score is 4 or above: No action needed, pain <4.  Have you developed a fever since your procedure? no  2.   Have you had an respiratory symptoms (SOB or cough) since your procedure? no  3.   Have you tested positive for COVID 19 since your procedure no  4.   Have you had any family members/close contacts diagnosed with the COVID 19 since your procedure?  no   If yes to any of these questions please route to Joylene John, RN and Joella Prince, RN

## 2021-08-26 ENCOUNTER — Ambulatory Visit (INDEPENDENT_AMBULATORY_CARE_PROVIDER_SITE_OTHER): Payer: BC Managed Care – PPO

## 2021-08-26 ENCOUNTER — Other Ambulatory Visit: Payer: Self-pay

## 2021-08-26 DIAGNOSIS — Z23 Encounter for immunization: Secondary | ICD-10-CM

## 2021-10-09 ENCOUNTER — Other Ambulatory Visit: Payer: BC Managed Care – PPO

## 2021-10-09 ENCOUNTER — Other Ambulatory Visit: Payer: Self-pay

## 2021-10-09 DIAGNOSIS — Z136 Encounter for screening for cardiovascular disorders: Secondary | ICD-10-CM

## 2021-10-09 DIAGNOSIS — Z0000 Encounter for general adult medical examination without abnormal findings: Secondary | ICD-10-CM

## 2021-10-09 DIAGNOSIS — I10 Essential (primary) hypertension: Secondary | ICD-10-CM

## 2021-10-09 LAB — LIPID PANEL
Cholesterol: 189 mg/dL (ref <200)
HDL: 79 mg/dL (ref >=50)
LDL Cholesterol (Calc): 96 mg/dL (calc)
Non-HDL Cholesterol (Calc): 110 mg/dL (calc) (ref <130)
Total CHOL/HDL Ratio: 2.4 (calc) (ref <5.0)
Triglycerides: 55 mg/dL (ref <150)

## 2021-10-09 LAB — COMPREHENSIVE METABOLIC PANEL
AG Ratio: 1.4 (calc) (ref 1.0–2.5)
ALT: 13 U/L (ref 6–29)
AST: 15 U/L (ref 10–35)
Albumin: 4 g/dL (ref 3.6–5.1)
Alkaline phosphatase (APISO): 47 U/L (ref 31–125)
BUN/Creatinine Ratio: 16 (calc) (ref 6–22)
BUN: 17 mg/dL (ref 7–25)
CO2: 26 mmol/L (ref 20–32)
Calcium: 9.3 mg/dL (ref 8.6–10.2)
Chloride: 107 mmol/L (ref 98–110)
Creat: 1.05 mg/dL — ABNORMAL HIGH (ref 0.50–0.99)
Globulin: 2.9 g/dL (calc) (ref 1.9–3.7)
Glucose, Bld: 88 mg/dL (ref 65–99)
Potassium: 4.4 mmol/L (ref 3.5–5.3)
Sodium: 140 mmol/L (ref 135–146)
Total Bilirubin: 0.4 mg/dL (ref 0.2–1.2)
Total Protein: 6.9 g/dL (ref 6.1–8.1)

## 2021-10-09 LAB — CBC WITH DIFFERENTIAL/PLATELET
Absolute Monocytes: 488 cells/uL (ref 200–950)
Basophils Absolute: 37 cells/uL (ref 0–200)
Basophils Relative: 0.5 %
Eosinophils Absolute: 385 cells/uL (ref 15–500)
Eosinophils Relative: 5.2 %
HCT: 39.8 % (ref 35.0–45.0)
Hemoglobin: 12.9 g/dL (ref 11.7–15.5)
Lymphs Abs: 2338 cells/uL (ref 850–3900)
MCH: 31.3 pg (ref 27.0–33.0)
MCHC: 32.4 g/dL (ref 32.0–36.0)
MCV: 96.6 fL (ref 80.0–100.0)
MPV: 10.2 fL (ref 7.5–12.5)
Monocytes Relative: 6.6 %
Neutro Abs: 4151 cells/uL (ref 1500–7800)
Neutrophils Relative %: 56.1 %
Platelets: 275 10*3/uL (ref 140–400)
RBC: 4.12 10*6/uL (ref 3.80–5.10)
RDW: 11.9 % (ref 11.0–15.0)
Total Lymphocyte: 31.6 %
WBC: 7.4 10*3/uL (ref 3.8–10.8)

## 2021-10-14 ENCOUNTER — Other Ambulatory Visit: Payer: Self-pay

## 2021-10-14 ENCOUNTER — Encounter: Payer: Self-pay | Admitting: Family Medicine

## 2021-10-14 ENCOUNTER — Ambulatory Visit (INDEPENDENT_AMBULATORY_CARE_PROVIDER_SITE_OTHER): Payer: BC Managed Care – PPO | Admitting: Family Medicine

## 2021-10-14 VITALS — BP 158/98 | HR 79 | Temp 97.9°F | Resp 18 | Ht 69.0 in | Wt 228.0 lb

## 2021-10-14 DIAGNOSIS — I10 Essential (primary) hypertension: Secondary | ICD-10-CM | POA: Diagnosis not present

## 2021-10-14 DIAGNOSIS — Z0000 Encounter for general adult medical examination without abnormal findings: Secondary | ICD-10-CM | POA: Diagnosis not present

## 2021-10-14 MED ORDER — METRONIDAZOLE 500 MG PO TABS: 500.0000 mg | ORAL_TABLET | Freq: Two times a day (BID) | ORAL | 0 refills | Status: AC

## 2021-10-14 NOTE — Progress Notes (Signed)
Subjective:    Patient ID: Ashley Wagner, female    DOB: Apr 22, 1976, 46 y.o.   MRN: [id number]  HPI  Patient is a very pleasant 46 year old African-American female who presents today for complete physical exam.  She denies any concerns.  Her blood pressure is elevated at 158/98.  She has not been taking her hydrochlorothiazide regularly.  Her mammogram and Pap smear performed by her gynecologist.  She had a colonoscopy last year that was clear except for diverticulosis although they recommended a repeat colonoscopy in 5 years due to her father's history of colon cancer.  Otherwise she is doing well.  Her most recent lab work is listed below and shows stage II chronic kidney disease Lab on 10/09/2021  Component Date Value Ref Range Status   Cholesterol 10/09/2021 189  <200 mg/dL Final   HDL 10/09/2021 79  > OR = 50 mg/dL Final   Triglycerides 10/09/2021 55  <150 mg/dL Final   LDL Cholesterol (Calc) 10/09/2021 96  mg/dL (calc) Final   Comment: Reference range: <100 . Desirable range <100 mg/dL for primary prevention;   <70 mg/dL for patients with CHD or diabetic patients  with > or = 2 CHD risk factors. Marland Kitchen LDL-C is now calculated using the Martin-Hopkins  calculation, which is a validated novel method providing  better accuracy than the Friedewald equation in the  estimation of LDL-C.  Cresenciano Genre et al. Annamaria Helling. 0277;412(87): 2061-2068  (http://education.[facility].com/faq/FAQ164)    Total CHOL/HDL Ratio 10/09/2021 2.4  <5.0 (calc) Final   Non-HDL Cholesterol (Calc) 10/09/2021 110  <130 mg/dL (calc) Final   Comment: For patients with diabetes plus 1 major ASCVD risk  factor, treating to a non-HDL-C goal of <100 mg/dL  (LDL-C of <70 mg/dL) is considered a therapeutic  option.    Glucose, Bld 10/09/2021 88  65 - 99 mg/dL Final   Comment: .            Fasting reference interval .    BUN 10/09/2021 17  7 - 25 mg/dL Final   Creat 10/09/2021 1.05 (H)  0.50 - 0.99 mg/dL Final    BUN/Creatinine Ratio 10/09/2021 16  6 - 22 (calc) Final   Sodium 10/09/2021 140  135 - 146 mmol/L Final   Potassium 10/09/2021 4.4  3.5 - 5.3 mmol/L Final   Chloride 10/09/2021 107  98 - 110 mmol/L Final   CO2 10/09/2021 26  20 - 32 mmol/L Final   Calcium 10/09/2021 9.3  8.6 - 10.2 mg/dL Final   Total Protein 10/09/2021 6.9  6.1 - 8.1 g/dL Final   Albumin 10/09/2021 4.0  3.6 - 5.1 g/dL Final   Globulin 10/09/2021 2.9  1.9 - 3.7 g/dL (calc) Final   AG Ratio 10/09/2021 1.4  1.0 - 2.5 (calc) Final   Total Bilirubin 10/09/2021 0.4  0.2 - 1.2 mg/dL Final   Alkaline phosphatase (APISO) 10/09/2021 47  31 - 125 U/L Final   AST 10/09/2021 15  10 - 35 U/L Final   ALT 10/09/2021 13  6 - 29 U/L Final   WBC 10/09/2021 7.4  3.8 - 10.8 Thousand/uL Final   RBC 10/09/2021 4.12  3.80 - 5.10 Million/uL Final   Hemoglobin 10/09/2021 12.9  11.7 - 15.5 g/dL Final   HCT 10/09/2021 39.8  35.0 - 45.0 % Final   MCV 10/09/2021 96.6  80.0 - 100.0 fL Final   MCH 10/09/2021 31.3  27.0 - 33.0 pg Final   MCHC 10/09/2021 32.4  32.0 - 36.0 g/dL  Final   RDW 10/09/2021 11.9  11.0 - 15.0 % Final   Platelets 10/09/2021 275  140 - 400 Thousand/uL Final   MPV 10/09/2021 10.2  7.5 - 12.5 fL Final   Neutro Abs 10/09/2021 4,151  1,500 - 7,800 cells/uL Final   Lymphs Abs 10/09/2021 2,338  850 - 3,900 cells/uL Final   Absolute Monocytes 10/09/2021 488  200 - 950 cells/uL Final   Eosinophils Absolute 10/09/2021 385  15 - 500 cells/uL Final   Basophils Absolute 10/09/2021 37  0 - 200 cells/uL Final   Neutrophils Relative % 10/09/2021 56.1  % Final   Total Lymphocyte 10/09/2021 31.6  % Final   Monocytes Relative 10/09/2021 6.6  % Final   Eosinophils Relative 10/09/2021 5.2  % Final   Basophils Relative 10/09/2021 0.5  % Final     Past Medical History:  Diagnosis Date   Diverticulosis of colon    GERD (gastroesophageal reflux disease)    on meds- and with certain foods   History of cervical dysplasia 2003   CIN I and II   s/p LEEP   History of chlamydia 2000   treated   HSV-2 infection    Hypertension    on meds   Menorrhagia    Uterine fibroid    Wears partial dentures    upper   Past Surgical History:  Procedure Laterality Date   CESAREAN SECTION  2004   COLONOSCOPY WITH ESOPHAGOGASTRODUODENOSCOPY (EGD)  2017  dr Henrene Pastor   DILATATION & CURETTAGE/HYSTEROSCOPY WITH TRUECLEAR N/A 09/07/2014   Procedure: DILATATION & CURETTAGE/HYSTEROSCOPY WITH TRUCLEAR;  Surgeon: Crawford Givens, MD;  Location: [facility];  Service: Gynecology;  Laterality: N/A;   DILITATION & CURRETTAGE/HYSTROSCOPY WITH NOVASURE ABLATION N/A 01/09/2021   Procedure: HYSTEROSCOPY WITH HYDROTHERMAL ABLATION;  Surgeon: Crawford Givens, MD;  Location: [place];  Service: Gynecology;  Laterality: N/A;   LAPAROSCOPIC BILATERAL SALPINGECTOMY Bilateral 01/09/2021   Procedure: LAPAROSCOPIC BILATERAL SALPINGECTOMY;  Surgeon: Crawford Givens, MD;  Location: [place] [facility];  Service: Gynecology;  Laterality: Bilateral;   LAPAROSCOPIC CHOLECYSTECTOMY  07/30/15   LEEP  2003   Current Outpatient Medications on File Prior to Visit  Medication Sig Dispense Refill   hydrochlorothiazide (HYDRODIURIL) 25 MG tablet Take 1 tablet (25 mg total) by mouth daily. (Patient taking differently: Take 25 mg by mouth daily.) 90 tablet 3   ibuprofen (ADVIL) 600 MG tablet take 1 tablet po pc every 6 hours for 5 days then as needed for post operative pain 30 tablet 1   pantoprazole (PROTONIX) 40 MG tablet Take 1 tablet (40 mg total) by mouth daily. 30 tablet 6   valACYclovir (VALTREX) 500 MG tablet Take 500 mg by mouth daily as needed (outbreaks).      No current facility-administered medications on file prior to visit.   No Known Allergies Social History   Socioeconomic History   Marital status: Married    Spouse name: Not on file   Number of children: 1   Years of education: Not on file   Highest education level: Not on file  Occupational  History   Occupation: Event organiser  Tobacco Use   Smoking status: Former    Years: 4.00    Types: Cigarettes    Quit date: 08/27/2001    Years since quitting: 20.1   Smokeless tobacco: Never  Vaping Use   Vaping Use: Never used  Substance and Sexual Activity   Alcohol use: Yes    Alcohol/week: 1.0 standard drink  Types: 1 Standard drinks or equivalent per week    Comment: occasional   Drug use: Never   Sexual activity: Yes    Partners: Male    Birth control/protection: Pill  Other Topics Concern   Not on file  Social History Narrative   Not on file   Social Determinants of Health   Financial Resource Strain: Not on file  Food Insecurity: Not on file  Transportation Needs: Not on file  Physical Activity: Not on file  Stress: Not on file  Social Connections: Not on file  Intimate Partner Violence: Not on file      Review of Systems  All other systems reviewed and are negative.     Objective:   Physical Exam Vitals reviewed.  Constitutional:      General: She is not in acute distress.    Appearance: She is well-developed. She is not diaphoretic.  HENT:     Head: Normocephalic and atraumatic.     Right Ear: External ear normal.     Left Ear: External ear normal.     Nose: Nose normal.     Mouth/Throat:     Pharynx: No oropharyngeal exudate.  Eyes:     General: No scleral icterus.       Right eye: No discharge.        Left eye: No discharge.     Conjunctiva/sclera: Conjunctivae normal.     Pupils: Pupils are equal, round, and reactive to light.  Neck:     Thyroid: No thyromegaly.     Vascular: No JVD.     Trachea: No tracheal deviation.  Cardiovascular:     Rate and Rhythm: Normal rate and regular rhythm.     Heart sounds: Normal heart sounds. No murmur heard.   No friction rub.  Pulmonary:     Effort: Pulmonary effort is normal. No respiratory distress.     Breath sounds: Normal breath sounds. No stridor. No wheezing or rales.  Chest:      Chest wall: No tenderness.  Abdominal:     General: Bowel sounds are normal. There is no distension.     Palpations: Abdomen is soft. There is no mass.     Tenderness: There is no guarding or rebound.  Musculoskeletal:        General: No tenderness or deformity. Normal range of motion.     Cervical back: Normal range of motion and neck supple.  Lymphadenopathy:     Cervical: No cervical adenopathy.  Skin:    General: Skin is warm.     Coloration: Skin is not pale.     Findings: No erythema or rash.  Neurological:     Mental Status: She is alert and oriented to person, place, and time.     Cranial Nerves: No cranial nerve deficit.     Motor: No abnormal muscle tone.     Coordination: Coordination normal.     Deep Tendon Reflexes: Reflexes are normal and symmetric. Reflexes normal.  Psychiatric:        Behavior: Behavior normal.        Thought Content: Thought content normal.        Judgment: Judgment normal.          Assessment & Plan:  General medical exam  Primary hypertension We discussed her lab work.  Her cholesterol is excellent.  Her CBC is normal.  I am concerned about her mild chronic kidney disease and I believe this is related to uncontrolled hypertension.  I recommended discontinuation of hydrochlorothiazide which the patient was not taking anyway and replacing it with just valsartan 160 mg daily.  She has some medication left over at home from previously having taken that.  She is going to try the valsartan over the next 2 weeks and call me with her blood pressures.  If doing well on the valsartan, we will call out of 160 mg daily of valsartan.  Mammogram and Pap smear performed by her gynecologist.  Colonoscopy is up-to-date.  I did give the patient Flagyl 500 mg twice daily for 7 days for possible BV

## 2021-10-17 DIAGNOSIS — N898 Other specified noninflammatory disorders of vagina: Secondary | ICD-10-CM | POA: Diagnosis not present

## 2021-10-17 DIAGNOSIS — Z01419 Encounter for gynecological examination (general) (routine) without abnormal findings: Secondary | ICD-10-CM | POA: Diagnosis not present

## 2021-11-14 DIAGNOSIS — R1031 Right lower quadrant pain: Secondary | ICD-10-CM | POA: Diagnosis not present

## 2021-11-14 DIAGNOSIS — R102 Pelvic and perineal pain: Secondary | ICD-10-CM | POA: Diagnosis not present

## 2021-12-30 DIAGNOSIS — Z01419 Encounter for gynecological examination (general) (routine) without abnormal findings: Secondary | ICD-10-CM | POA: Diagnosis not present

## 2021-12-30 DIAGNOSIS — Z6834 Body mass index (BMI) 34.0-34.9, adult: Secondary | ICD-10-CM | POA: Diagnosis not present

## 2021-12-30 DIAGNOSIS — N871 Moderate cervical dysplasia: Secondary | ICD-10-CM | POA: Diagnosis not present

## 2021-12-30 DIAGNOSIS — Z124 Encounter for screening for malignant neoplasm of cervix: Secondary | ICD-10-CM | POA: Diagnosis not present

## 2021-12-30 DIAGNOSIS — Z1231 Encounter for screening mammogram for malignant neoplasm of breast: Secondary | ICD-10-CM | POA: Diagnosis not present

## 2021-12-30 DIAGNOSIS — Z01411 Encounter for gynecological examination (general) (routine) with abnormal findings: Secondary | ICD-10-CM | POA: Diagnosis not present

## 2021-12-30 DIAGNOSIS — Z113 Encounter for screening for infections with a predominantly sexual mode of transmission: Secondary | ICD-10-CM | POA: Diagnosis not present

## 2022-02-27 ENCOUNTER — Telehealth: Payer: Self-pay

## 2022-02-27 NOTE — Telephone Encounter (Signed)
Pt called stated that pt would like a Rx for Valsartan for B/P. Per pt  stated that have talked to you about this previously because she was taking her husbands meds but he has change his med and now she is out of it. Pt want to go to [facility] on [address]  Please advice

## 2022-03-02 ENCOUNTER — Other Ambulatory Visit: Payer: Self-pay | Admitting: Family Medicine

## 2022-03-02 MED ORDER — VALSARTAN 160 MG PO TABS: 160.0000 mg | ORAL_TABLET | Freq: Every day | ORAL | 3 refills | Status: DC

## 2022-03-02 NOTE — Telephone Encounter (Signed)
Spoke with pt, aware Rx sent to pharmacy.

## 2022-04-15 DIAGNOSIS — J019 Acute sinusitis, unspecified: Secondary | ICD-10-CM | POA: Insufficient documentation

## 2022-04-15 DIAGNOSIS — J069 Acute upper respiratory infection, unspecified: Secondary | ICD-10-CM | POA: Diagnosis not present

## 2022-11-23 ENCOUNTER — Other Ambulatory Visit: Payer: Self-pay | Admitting: Internal Medicine

## 2022-11-26 ENCOUNTER — Encounter: Payer: Self-pay | Admitting: Family Medicine

## 2022-11-26 ENCOUNTER — Ambulatory Visit: Payer: BC Managed Care – PPO | Admitting: Family Medicine

## 2022-11-26 VITALS — BP 132/86 | HR 91 | Temp 98.3°F | Ht 69.0 in | Wt 225.0 lb

## 2022-11-26 DIAGNOSIS — R1031 Right lower quadrant pain: Secondary | ICD-10-CM

## 2022-11-26 DIAGNOSIS — M545 Low back pain, unspecified: Secondary | ICD-10-CM

## 2022-11-26 LAB — URINALYSIS, ROUTINE W REFLEX MICROSCOPIC
Bilirubin Urine: NEGATIVE
Glucose, UA: NEGATIVE
Hyaline Cast: NONE SEEN /LPF
Ketones, ur: NEGATIVE
Leukocytes,Ua: NEGATIVE
Nitrite: NEGATIVE
Specific Gravity, Urine: 1.024 (ref 1.001–1.035)
pH: 5.5 (ref 5.0–8.0)

## 2022-11-26 LAB — MICROSCOPIC MESSAGE

## 2022-11-26 MED ORDER — AZELASTINE HCL 0.15 % NA SOLN
2.0000 | Freq: Two times a day (BID) | NASAL | 3 refills | Status: DC
Start: 2022-11-26 — End: 2023-03-29

## 2022-11-26 MED ORDER — OXYCODONE-ACETAMINOPHEN 7.5-325 MG PO TABS: 1.0000 | ORAL_TABLET | ORAL | 0 refills | Status: DC | PRN

## 2022-11-26 MED ORDER — TAMSULOSIN HCL 0.4 MG PO CAPS: 0.4000 mg | ORAL_CAPSULE | Freq: Every day | ORAL | 0 refills | Status: DC

## 2022-11-26 NOTE — Progress Notes (Signed)
Subjective:    Patient ID: Ashley Wagner, female    DOB: 1976-01-17, 47 y.o.   MRN: [id number]  Back Pain  Patient is a very pleasant 47 year old African-American female who has been having sharp severe right flank pain for 3 weeks.  It comes and goes in waves.  It can be intense.  Movement has nothing to do with the pain.  It hurts whether she is lying still or up walking.  She denies any dysuria but she does have hematuria with blood in her urinalysis today.  She denies any fevers or chills.  She is tender to palpation in the right lower quadrant and in the right flank but movement does not seem to exacerbate the pain.  She denies any cough or pleurisy.  + Past Medical History:  Diagnosis Date   Diverticulosis of colon    GERD (gastroesophageal reflux disease)    on meds- and with certain foods   History of cervical dysplasia 2003   CIN I and II  s/p LEEP   History of chlamydia 2000   treated   HSV-2 infection    Hypertension    on meds   Menorrhagia    Uterine fibroid    Wears partial dentures    upper   Past Surgical History:  Procedure Laterality Date   CESAREAN SECTION  2004   COLONOSCOPY WITH ESOPHAGOGASTRODUODENOSCOPY (EGD)  2017  dr Henrene Pastor   DILATATION & CURETTAGE/HYSTEROSCOPY WITH TRUECLEAR N/A 09/07/2014   Procedure: DILATATION & CURETTAGE/HYSTEROSCOPY WITH TRUCLEAR;  Surgeon: Crawford Givens, MD;  Location: [facility];  Service: Gynecology;  Laterality: N/A;   DILITATION & CURRETTAGE/HYSTROSCOPY WITH NOVASURE ABLATION N/A 01/09/2021   Procedure: HYSTEROSCOPY WITH HYDROTHERMAL ABLATION;  Surgeon: Crawford Givens, MD;  Location: [place];  Service: Gynecology;  Laterality: N/A;   LAPAROSCOPIC BILATERAL SALPINGECTOMY Bilateral 01/09/2021   Procedure: LAPAROSCOPIC BILATERAL SALPINGECTOMY;  Surgeon: Crawford Givens, MD;  Location: [facility];  Service: Gynecology;  Laterality: Bilateral;   LAPAROSCOPIC CHOLECYSTECTOMY  07/30/15   LEEP  2003    Current Outpatient Medications on File Prior to Visit  Medication Sig Dispense Refill   ibuprofen (ADVIL) 600 MG tablet take 1 tablet po pc every 6 hours for 5 days then as needed for post operative pain 30 tablet 1   pantoprazole (PROTONIX) 40 MG tablet TAKE 1 TABLET BY MOUTH EVERY DAY 90 tablet 0   valACYclovir (VALTREX) 500 MG tablet Take 500 mg by mouth daily as needed (outbreaks).      valsartan (DIOVAN) 160 MG tablet Take 1 tablet (160 mg total) by mouth daily. 90 tablet 3   No current facility-administered medications on file prior to visit.   No Known Allergies Social History   Socioeconomic History   Marital status: Married    Spouse name: Not on file   Number of children: 1   Years of education: Not on file   Highest education level: Not on file  Occupational History   Occupation: Event organiser  Tobacco Use   Smoking status: Former    Years: 4    Types: Cigarettes    Quit date: 08/27/2001    Years since quitting: 21.2   Smokeless tobacco: Never  Vaping Use   Vaping Use: Never used  Substance and Sexual Activity   Alcohol use: Yes    Alcohol/week: 1.0 standard drink of alcohol    Types: 1 Standard drinks or equivalent per week    Comment: occasional   Drug use:  Never   Sexual activity: Yes    Partners: Male    Birth control/protection: Pill  Other Topics Concern   Not on file  Social History Narrative   Not on file   Social Determinants of Health   Financial Resource Strain: Not on file  Food Insecurity: Not on file  Transportation Needs: Not on file  Physical Activity: Not on file  Stress: Not on file  Social Connections: Not on file  Intimate Partner Violence: Not on file      Review of Systems  Musculoskeletal:  Positive for back pain.  All other systems reviewed and are negative.      Objective:   Physical Exam Vitals reviewed.  Constitutional:      General: She is not in acute distress.    Appearance: She is  well-developed. She is not diaphoretic.  HENT:     Head: Normocephalic and atraumatic.     Right Ear: External ear normal.     Left Ear: External ear normal.     Nose: Nose normal.     Mouth/Throat:     Pharynx: No oropharyngeal exudate.  Eyes:     General: No scleral icterus.       Right eye: No discharge.        Left eye: No discharge.     Conjunctiva/sclera: Conjunctivae normal.     Pupils: Pupils are equal, round, and reactive to light.  Neck:     Thyroid: No thyromegaly.     Vascular: No JVD.     Trachea: No tracheal deviation.  Cardiovascular:     Rate and Rhythm: Normal rate and regular rhythm.     Heart sounds: Normal heart sounds. No murmur heard.    No friction rub.  Pulmonary:     Effort: Pulmonary effort is normal. No respiratory distress.     Breath sounds: Normal breath sounds. No stridor. No wheezing or rales.  Chest:     Chest wall: No tenderness.  Abdominal:     General: Bowel sounds are normal. There is no distension.     Palpations: Abdomen is soft. There is no mass.     Tenderness: There is abdominal tenderness. There is no guarding or rebound.     Musculoskeletal:        General: No tenderness or deformity. Normal range of motion.       Arms:     Cervical back: Normal range of motion and neck supple.  Lymphadenopathy:     Cervical: No cervical adenopathy.  Skin:    General: Skin is warm.     Coloration: Skin is not pale.     Findings: No erythema or rash.  Neurological:     Mental Status: She is alert and oriented to person, place, and time.     Cranial Nerves: No cranial nerve deficit.     Motor: No abnormal muscle tone.     Coordination: Coordination normal.     Deep Tendon Reflexes: Reflexes are normal and symmetric.  Psychiatric:        Behavior: Behavior normal.        Thought Content: Thought content normal.        Judgment: Judgment normal.           Assessment & Plan:  Acute right-sided low back pain, unspecified whether  sciatica present - Plan: Urinalysis, Routine w reflex microscopic  Right lower quadrant abdominal pain - Plan: CT Abdomen Pelvis Wo Contrast  I am very concerned the  patient may have a kidney stone.  Begin Flomax 0.4 mg daily.  Use oxycodone for breakthrough pain.  Push fluids.  Schedule CT scan of the abdomen and pelvis soon as possible given that the pain has been there for 3 weeks and concerned that the stone may be lodged and that she may be developing hydronephrosis.

## 2022-11-27 ENCOUNTER — Ambulatory Visit
Admission: RE | Admit: 2022-11-27 | Discharge: 2022-11-27 | Disposition: A | Discharge status: Home / Self Care | Payer: BC Managed Care – PPO | Source: Ambulatory Visit | Attending: Family Medicine | Admitting: Family Medicine

## 2022-11-27 DIAGNOSIS — R1031 Right lower quadrant pain: Secondary | ICD-10-CM

## 2022-11-27 DIAGNOSIS — N281 Cyst of kidney, acquired: Secondary | ICD-10-CM | POA: Diagnosis not present

## 2022-11-30 ENCOUNTER — Encounter: Payer: Self-pay | Admitting: Family Medicine

## 2022-11-30 ENCOUNTER — Other Ambulatory Visit: Payer: Self-pay | Admitting: Family Medicine

## 2022-11-30 DIAGNOSIS — R109 Unspecified abdominal pain: Secondary | ICD-10-CM

## 2022-12-08 ENCOUNTER — Encounter: Payer: Self-pay | Admitting: Internal Medicine

## 2022-12-18 ENCOUNTER — Other Ambulatory Visit: Payer: Self-pay | Admitting: Family Medicine

## 2022-12-18 DIAGNOSIS — R1031 Right lower quadrant pain: Secondary | ICD-10-CM

## 2022-12-18 NOTE — Telephone Encounter (Signed)
Requested Prescriptions  Pending Prescriptions Disp Refills   tamsulosin (FLOMAX) 0.4 MG CAPS capsule [Pharmacy Med Name: TAMSULOSIN HCL 0.4 MG CAPSULE] 90 capsule 0    Sig: TAKE 1 CAPSULE BY MOUTH EVERY DAY     Urology: Alpha-Adrenergic Blocker Failed - 12/18/2022  9:30 AM      Failed - PSA in normal range and within 360 days    No results found for: "LABPSA", "PSA", "PSA1", "ULTRAPSA"       Failed - Valid encounter within last 12 months    Recent Outpatient Visits           1 year ago General medical exam   [facility] Donita Brooks, MD   2 years ago LLQ abdominal pain   [facility] Tanya Nones, Priscille Heidelberg, MD   2 years ago LLQ abdominal pain   [facility] Tanya Nones, Priscille Heidelberg, MD   2 years ago General medical exam   [facility] Donita Brooks, MD   3 years ago Subacute frontal sinusitis   [facility] Pickard, Priscille Heidelberg, MD              Passed - Last BP in normal range    BP Readings from Last 1 Encounters:  11/26/22 132/86

## 2022-12-28 ENCOUNTER — Ambulatory Visit: Payer: BC Managed Care – PPO | Admitting: Nurse Practitioner

## 2022-12-28 ENCOUNTER — Encounter: Payer: Self-pay | Admitting: Nurse Practitioner

## 2022-12-28 ENCOUNTER — Other Ambulatory Visit (INDEPENDENT_AMBULATORY_CARE_PROVIDER_SITE_OTHER): Payer: BC Managed Care – PPO

## 2022-12-28 VITALS — BP 138/80 | HR 80 | Ht 67.0 in | Wt 229.1 lb

## 2022-12-28 DIAGNOSIS — R1011 Right upper quadrant pain: Secondary | ICD-10-CM

## 2022-12-28 DIAGNOSIS — M546 Pain in thoracic spine: Secondary | ICD-10-CM

## 2022-12-28 LAB — HEPATIC FUNCTION PANEL
ALT: 11 U/L (ref 0–35)
AST: 15 U/L (ref 0–37)
Albumin: 4.1 g/dL (ref 3.5–5.2)
Alkaline Phosphatase: 47 U/L (ref 39–117)
Bilirubin, Direct: 0.1 mg/dL (ref 0.0–0.3)
Total Bilirubin: 0.4 mg/dL (ref 0.2–1.2)
Total Protein: 7.2 g/dL (ref 6.0–8.3)

## 2022-12-28 LAB — CBC
HCT: 39.4 % (ref 36.0–46.0)
Hemoglobin: 13.1 g/dL (ref 12.0–15.0)
MCHC: 33.1 g/dL (ref 30.0–36.0)
MCV: 96.5 fl (ref 78.0–100.0)
Platelets: 271 10*3/uL (ref 150.0–400.0)
RBC: 4.08 Mil/uL (ref 3.87–5.11)
RDW: 13.8 % (ref 11.5–15.5)
WBC: 7.8 10*3/uL (ref 4.0–10.5)

## 2022-12-28 MED ORDER — PANTOPRAZOLE SODIUM 40 MG PO TBEC: 40.0000 mg | DELAYED_RELEASE_TABLET | Freq: Every day | ORAL | 0 refills | Status: DC

## 2022-12-28 NOTE — Progress Notes (Signed)
Assessment and Plan   Primary Gi: [name], MD   Brief Narrative:  47 y.o. yo female with a past medical history consisting of, but not limited to GERD, obesity, HTN, diverticulosis  # Right mid back pain sometimes radiating around to RUQ. Similar pain in years past. There seems to be some component of musculoskeletal pain given rib tenderness to palpation on exam. However, pain improved with a bowel regimen which cannot be readily explained but is similar to what she had in 2017.  No acute findings on non-contrast CT scan (for this pain). She is post cholecystectomy -Obtain LFTs, CBC -Continue daily Miralax to keep bowels moving -Trial of Salon Paz patches x 5 days -Trial of Ibuprofen 600 mg TID x 5 days -Continue pantoprazole 40 mg daily x 5 days then can discontinue ( see below) -In 2 weeks call [phone] and ask to speak with [facility] LPN to give a condition update.     GERD, occasional regurgitation at night.  She has been off Pantoprazole for more than a year and other than occasional nocturnal regurgitation she hasn't had any reflux symptoms. She was originally on Pantoprazole for LUQ pain /  concern for PUD.  -Pantoprazole recently resumed for above RUQ pain.  It doesn't seem indicated but will continue for the next few days while undergoing evaluation of pain and while taking NSAIDs.    -Can take famotidine 20 mg at bedtime as needed for reflux symptoms -Discussed anti-reflux measures such as avoidance of late meals / bedtime snacks, HOB elevation (or use of wedge pillow), weight reduction ( if applicable)  / maintaining a healthy BMI ( body mass index),  and avoidance of trigger foods and caffeine.   [facility] of colon cancer in father age 5. Also paternal grandmother had colon cancer in mid 79.  Ashley Wagner is up to date on her screening colonoscopy.    History of Present Illness   Chief complaint:  right mid back pain radiating around toward RUQ  Ashley Wagner was seen by her  PCP for 4/24 for 3 weeks worth of severe right flank pain and nausea.  UA + blood.  Out of concern for kidney stone she was started on Flomax and sent for CT scan.  Noncontrast CT scan for 5/24 without any acute findings to explain pain. Flomax stopped. She started a bowel regimen of MiraLAX and Dulcolax with a good response followed by improvement in the back/upper abdominal pain. Around the same time she also resumed pantoprazole. Which was originally started for LUQ pain/concern for PUD.  Subsequently an EGD was unrevealing.  She stopped the pantoprazole over reflux medication   Of note, reviewed our 2017 office notes.  She was seen then for chronic right-sided abdominal pain felt to be musculoskeletal/rib pain.  Effects on the pain included meals and bowel movements similar to now  Previous GI Endoscopies / Labs / Imaging   Oct 2022 colonoscopy  -Diverticulosis in the left colon and in the right colon. - Internal hemorrhoids. - The examination was otherwise normal on direct and retroflexion views. - No specimens collected.  Oct 2022 EGD for LUQ pain  -- The esophagus was normal. - The stomach was normal, save small hiatal hernia. - The examined duodenum was normal. - The cardia and gastric fundus were normal on retroflexion.      Latest Ref Rng & Units 10/09/2021    8:13 AM 01/26/2020    8:57 AM 03/13/2016   12:50 PM  Hepatic Function  Total Protein 6.1 - 8.1 g/dL 6.9  6.8  7.0   Albumin 3.6 - 5.1 g/dL   3.9   AST 10 - 35 U/L 15  16  16    ALT 6 - 29 U/L 13  14  14    Alk Phosphatase 33 - 115 U/L   51   Total Bilirubin 0.2 - 1.2 mg/dL 0.4  0.4  0.3        Latest Ref Rng & Units 10/09/2021    8:13 AM 01/26/2020    8:57 AM 03/13/2016   12:50 PM  CBC  WBC 3.8 - 10.8 Thousand/uL 7.4  7.0  6.2   Hemoglobin 11.7 - 15.5 g/dL 57.8  46.9  62.9   Hematocrit 35.0 - 45.0 % 39.8  41.4  40.4   Platelets 140 - 400 Thousand/uL 275  287  328    12/26/22 non-contrast CT scn  1. No acute finding in the  abdomen or pelvis to explain the patient's pain. No renal stones or hydroureteronephrosis, and no evidence of acute appendicitis. 2. Right colonic diverticula without evidence of acute diverticulitis    Past Medical History:  Diagnosis Date   Diverticulosis of colon    GERD (gastroesophageal reflux disease)    on meds- and with certain foods   History of cervical dysplasia 2003   CIN I and II  s/p LEEP   History of chlamydia 2000   treated   HSV-2 infection    Hypertension    on meds   Menorrhagia    Uterine fibroid    Wears partial dentures    upper    Past Surgical History:  Procedure Laterality Date   CESAREAN SECTION  2004   COLONOSCOPY WITH ESOPHAGOGASTRODUODENOSCOPY (EGD)  2017  dr Marina Goodell   DILATATION & CURETTAGE/HYSTEROSCOPY WITH TRUECLEAR N/A 09/07/2014   Procedure: DILATATION & CURETTAGE/HYSTEROSCOPY WITH TRUCLEAR;  Surgeon: Jaymes Graff, MD;  Location: [facility];  Service: Gynecology;  Laterality: N/A;   DILITATION & CURRETTAGE/HYSTROSCOPY WITH NOVASURE ABLATION N/A 01/09/2021   Procedure: HYSTEROSCOPY WITH HYDROTHERMAL ABLATION;  Surgeon: Jaymes Graff, MD;  Location: [facility];  Service: Gynecology;  Laterality: N/A;   LAPAROSCOPIC BILATERAL SALPINGECTOMY Bilateral 01/09/2021   Procedure: LAPAROSCOPIC BILATERAL SALPINGECTOMY;  Surgeon: Jaymes Graff, MD;  Location: [facility];  Service: Gynecology;  Laterality: Bilateral;   LAPAROSCOPIC CHOLECYSTECTOMY  07/30/15   LEEP  2003    Current Medications, Allergies, Family History and Social History were reviewed in [employer] record.     Current Outpatient Medications  Medication Sig Dispense Refill   Azelastine HCl (ASTEPRO) 0.15 % SOLN Place 2 sprays into the nose in the morning and at bedtime. 30 mL 3   ibuprofen (ADVIL) 600 MG tablet take 1 tablet po pc every 6 hours for 5 days then as needed for post operative pain 30 tablet 1   oxyCODONE-acetaminophen  (PERCOCET) 7.5-325 MG tablet Take 1 tablet by mouth every 4 (four) hours as needed for severe pain. 30 tablet 0   pantoprazole (PROTONIX) 40 MG tablet TAKE 1 TABLET BY MOUTH EVERY DAY 90 tablet 0   tamsulosin (FLOMAX) 0.4 MG CAPS capsule TAKE 1 CAPSULE BY MOUTH EVERY DAY 90 capsule 0   valACYclovir (VALTREX) 500 MG tablet Take 500 mg by mouth daily as needed (outbreaks).      valsartan (DIOVAN) 160 MG tablet Take 1 tablet (160 mg total) by mouth daily. 90 tablet 3   No current facility-administered medications for this visit.  Review of Systems: No chest pain. No shortness of breath. No urinary complaints.    Physical Exam  Wt Readings from Last 3 Encounters:  11/26/22 225 lb (102.1 kg)  10/14/21 228 lb (103.4 kg)  06/20/21 219 lb (99.3 kg)    BP 138/80 (BP Location: Left Arm, Patient Position: Sitting, Cuff Size: Large)   Pulse 80   Ht 5\' 7"  (1.702 m) Comment: height measured without shoes  Wt 229 lb 2 oz (103.9 kg)   LMP 12/14/2022   BMI 35.89 kg/m  Constitutional:  Pleasant, generally well appearing female in no acute distress. Psychiatric: Normal mood and affect. Behavior is normal. EENT: Pupils normal.  Conjunctivae are normal. No scleral icterus. Neck supple.  Cardiovascular: Normal rate, regular rhythm.  Pulmonary/chest: Effort normal and breath sounds normal. No wheezing, rales or rhonchi. Abdominal: Soft, nondistended, nontender. Bowel sounds active throughout. There are no masses palpable. No hepatomegaly. Neurological: Alert and oriented to person place and time.  Musculoskeletal:  tender to palpation along bottom rib posteriorly and anteriorly Skin: Skin is warm and dry. No rashes noted.  Willette Cluster, NP  12/28/2022, 1:27 PM  Cc:  Donita Brooks, MD

## 2022-12-28 NOTE — Patient Instructions (Addendum)
Acid Reflux  Below are some measures you can take to possibly improve acid reflux symptoms . We may have discussed some of these today in the office. Not everything on this list may apply to you   --If you are taking anti-reflux ( GERD) medication be sure to take it 30 minutes before breakfast and if taking twice daily then also second dose should be 30 minutes before dinner.   --Avoid late meals / bedtime snacks.   --Avoid trigger foods ( foods which you know tend to aggravate you reflux symptoms). Some common trigger foods include spicy foods, fatty foods, acidic foods, chocolate and caffeine.  --Elevate the head of bed 6-8 inches on blocks or bricks. If not able to elevate the head of the bed consider purchasing a wedge pillow to sleep on.    --Weight reduction / maintain a healthy BMI ( body mass index) may be help with reflux symptoms  --Sometimes with the above mentioned "lifestyle changes" patients are able to reduce the amount of GERD medications they take. Our goal is to have you on the lowest effective dose of medication   In 3 weeks call [phone] and ask to speak with [facility], LPN to give  a condition update. If improved    Please continue your daily Miralax    Your provider has requested that you go to the basement level for lab work before leaving today. Press "B" on the elevator. The lab is located at the first door on the left as you exit the elevator.  We have sent the following medications to your pharmacy for you to pick up at your convenience: Pantoprazole- 5 days  Salon pas- use for 5 days   Please purchase the following medications over the counter and take as directed: Advil- 600mg  for 5 days- 3 times a day  _______________________________________________________  If your blood pressure at your visit was 140/90 or greater, please contact your primary care physician to follow up on this.  _______________________________________________________  If you are age  21 or older, your body mass index should be between 23-30. Your Body mass index is 35.89 kg/m. If this is out of the aforementioned range listed, please consider follow up with your Primary Care Provider.  If you are age 39 or younger, your body mass index should be between 19-25. Your Body mass index is 35.89 kg/m. If this is out of the aformentioned range listed, please consider follow up with your Primary Care Provider.   ________________________________________________________  The [facility] providers would like to encourage you to use [facility] to communicate with providers for non-urgent requests or questions.  Due to long hold times on the telephone, sending your provider a message by [facility] may be a faster and more efficient way to get a response.  Please allow 48 business hours for a response.  Please remember that this is for non-urgent requests.  _______________________________________________________ It was a pleasure to see you today!  Thank you for trusting me with your gastrointestinal care!

## 2023-01-04 NOTE — Progress Notes (Signed)
Noted  

## 2023-01-21 DIAGNOSIS — Z01411 Encounter for gynecological examination (general) (routine) with abnormal findings: Secondary | ICD-10-CM | POA: Diagnosis not present

## 2023-01-21 DIAGNOSIS — Z113 Encounter for screening for infections with a predominantly sexual mode of transmission: Secondary | ICD-10-CM | POA: Diagnosis not present

## 2023-01-21 DIAGNOSIS — Z1231 Encounter for screening mammogram for malignant neoplasm of breast: Secondary | ICD-10-CM | POA: Diagnosis not present

## 2023-01-21 DIAGNOSIS — Z01419 Encounter for gynecological examination (general) (routine) without abnormal findings: Secondary | ICD-10-CM | POA: Diagnosis not present

## 2023-01-21 DIAGNOSIS — Z1239 Encounter for other screening for malignant neoplasm of breast: Secondary | ICD-10-CM | POA: Diagnosis not present

## 2023-01-21 DIAGNOSIS — Z124 Encounter for screening for malignant neoplasm of cervix: Secondary | ICD-10-CM | POA: Diagnosis not present

## 2023-02-09 ENCOUNTER — Other Ambulatory Visit: Payer: Self-pay | Admitting: Family Medicine

## 2023-02-09 MED ORDER — AMOXICILLIN-POT CLAVULANATE 875-125 MG PO TABS: 1.0000 | ORAL_TABLET | Freq: Two times a day (BID) | ORAL | 0 refills | Status: DC

## 2023-02-24 ENCOUNTER — Ambulatory Visit: Payer: BC Managed Care – PPO | Admitting: Internal Medicine

## 2023-03-29 ENCOUNTER — Ambulatory Visit: Payer: BC Managed Care – PPO | Admitting: Family Medicine

## 2023-03-29 ENCOUNTER — Encounter: Payer: Self-pay | Admitting: Family Medicine

## 2023-03-29 VITALS — BP 136/82 | HR 82 | Temp 98.4°F | Ht 67.0 in | Wt 234.0 lb

## 2023-03-29 DIAGNOSIS — H6692 Otitis media, unspecified, left ear: Secondary | ICD-10-CM

## 2023-03-29 MED ORDER — AMOXICILLIN-POT CLAVULANATE 875-125 MG PO TABS: 1.0000 | ORAL_TABLET | Freq: Two times a day (BID) | ORAL | 0 refills | Status: DC

## 2023-03-29 MED ORDER — PREDNISONE 20 MG PO TABS
ORAL_TABLET | ORAL | 0 refills | Status: DC
Start: 2023-03-29 — End: 2023-08-23

## 2023-03-29 MED ORDER — TRIAMCINOLONE ACETONIDE 55 MCG/ACT NA AERO: 2.0000 | INHALATION_SPRAY | Freq: Every day | NASAL | 12 refills | Status: DC

## 2023-03-29 NOTE — Progress Notes (Signed)
Subjective:    Patient ID: Ashley Wagner, female    DOB: [birth year], 47 y.o.   MRN: [id number]  Patient reports a 1 week history of decreased hearing in her left ear.  She also report chronic sinus issues for the last month.  Ports a chronic runny nose only on the left nostril.  She reports pain and pressure in the left side of her face recently while flying.  She reports pain in her left ear while flying.  On examination today, there is no obvious perforation in the left tympanic membrane.  However it is extremely erythematous with dilated blood vessels.  It appears to have decreased mobility. Past Medical History:  Diagnosis Date   Diverticulosis of colon    GERD (gastroesophageal reflux disease)    on meds- and with certain foods   History of cervical dysplasia 2003   CIN I and II  s/p LEEP   History of chlamydia 2000   treated   HSV-2 infection    Hypertension    on meds   Menorrhagia    Uterine fibroid    Wears partial dentures    upper   Past Surgical History:  Procedure Laterality Date   CESAREAN SECTION  2004   COLONOSCOPY WITH ESOPHAGOGASTRODUODENOSCOPY (EGD)  2017  dr Marina Goodell   DILATATION & CURETTAGE/HYSTEROSCOPY WITH TRUECLEAR N/A 09/07/2014   Procedure: DILATATION & CURETTAGE/HYSTEROSCOPY WITH TRUCLEAR;  Surgeon: Jaymes Graff, MD;  Location: [facility];  Service: Gynecology;  Laterality: N/A;   DILITATION & CURRETTAGE/HYSTROSCOPY WITH NOVASURE ABLATION N/A 01/09/2021   Procedure: HYSTEROSCOPY WITH HYDROTHERMAL ABLATION;  Surgeon: Jaymes Graff, MD;  Location: [facility];  Service: Gynecology;  Laterality: N/A;   LAPAROSCOPIC BILATERAL SALPINGECTOMY Bilateral 01/09/2021   Procedure: LAPAROSCOPIC BILATERAL SALPINGECTOMY;  Surgeon: Jaymes Graff, MD;  Location: [facility];  Service: Gynecology;  Laterality: Bilateral;   LAPAROSCOPIC CHOLECYSTECTOMY  07/30/15   LEEP  2003   Current Outpatient Medications on File Prior to Visit  Medication Sig  Dispense Refill   amoxicillin-clavulanate (AUGMENTIN) 875-125 MG tablet Take 1 tablet by mouth 2 (two) times daily. 20 tablet 0   Azelastine HCl (ASTEPRO) 0.15 % SOLN Place 2 sprays into the nose in the morning and at bedtime. (Patient not taking: Reported on 12/28/2022) 30 mL 3   ibuprofen (ADVIL) 200 MG tablet Take 200-600 mg by mouth as needed.     oxyCODONE-acetaminophen (PERCOCET) 7.5-325 MG tablet Take 1 tablet by mouth every 4 (four) hours as needed for severe pain. (Patient not taking: Reported on 12/28/2022) 30 tablet 0   pantoprazole (PROTONIX) 40 MG tablet Take 1 tablet (40 mg total) by mouth daily. 90 tablet 0   valACYclovir (VALTREX) 500 MG tablet Take 500 mg by mouth daily as needed (outbreaks).      valsartan (DIOVAN) 160 MG tablet Take 1 tablet (160 mg total) by mouth daily. 90 tablet 3   No current facility-administered medications on file prior to visit.   No Known Allergies Social History   Socioeconomic History   Marital status: Married    Spouse name: Not on file   Number of children: 1   Years of education: Not on file   Highest education level: Not on file  Occupational History   Occupation: Retail banker  Tobacco Use   Smoking status: Former    Current packs/day: 0.00    Types: Cigarettes    Start date: 08/27/1997    Quit date: 08/27/2001    Years since  quitting: 21.6   Smokeless tobacco: Never  Vaping Use   Vaping status: Never Used  Substance and Sexual Activity   Alcohol use: Yes    Alcohol/week: 1.0 standard drink of alcohol    Types: 1 Standard drinks or equivalent per week    Comment: occasional   Drug use: Never   Sexual activity: Yes    Partners: Male    Birth control/protection: Pill  Other Topics Concern   Not on file  Social History Narrative   Not on file   Social Determinants of Health   Financial Resource Strain: Not on file  Food Insecurity: Not on file  Transportation Needs: Not on file  Physical Activity: Not on file   Stress: Not on file  Social Connections: Not on file  Intimate Partner Violence: Not on file      Review of Systems  Musculoskeletal:  Positive for back pain.  All other systems reviewed and are negative.      Objective:   Physical Exam Vitals reviewed.  Constitutional:      General: She is not in acute distress.    Appearance: She is well-developed. She is not diaphoretic.  HENT:     Head: Normocephalic and atraumatic.     Right Ear: Tympanic membrane, ear canal and external ear normal.     Left Ear: External ear normal. Tympanic membrane is injected, erythematous and bulging. Tympanic membrane has decreased mobility.     Nose: Nose normal.     Mouth/Throat:     Pharynx: No oropharyngeal exudate.  Eyes:     General: No scleral icterus.       Right eye: No discharge.        Left eye: No discharge.     Conjunctiva/sclera: Conjunctivae normal.     Pupils: Pupils are equal, round, and reactive to light.  Neck:     Thyroid: No thyromegaly.     Vascular: No JVD.     Trachea: No tracheal deviation.  Cardiovascular:     Rate and Rhythm: Normal rate and regular rhythm.     Heart sounds: Normal heart sounds. No murmur heard.    No friction rub.  Pulmonary:     Effort: Pulmonary effort is normal. No respiratory distress.     Breath sounds: Normal breath sounds. No stridor. No wheezing or rales.  Chest:     Chest wall: No tenderness.  Musculoskeletal:     Cervical back: Normal range of motion and neck supple.  Lymphadenopathy:     Cervical: No cervical adenopathy.  Skin:    General: Skin is warm.     Coloration: Skin is not pale.     Findings: No erythema or rash.  Neurological:     Mental Status: She is alert and oriented to person, place, and time.     Cranial Nerves: No cranial nerve deficit.     Motor: No abnormal muscle tone.     Coordination: Coordination normal.     Deep Tendon Reflexes: Reflexes are normal and symmetric.           Assessment & Plan:   Left otitis media, unspecified otitis media type Begin Augmentin 875 mg twice daily with a prednisone taper pack.  Reassess in 1 week if no better or sooner if worse

## 2023-04-02 ENCOUNTER — Other Ambulatory Visit: Payer: Self-pay | Admitting: Family Medicine

## 2023-04-02 ENCOUNTER — Encounter: Payer: Self-pay | Admitting: Family Medicine

## 2023-04-02 DIAGNOSIS — H9192 Unspecified hearing loss, left ear: Secondary | ICD-10-CM

## 2023-04-13 ENCOUNTER — Other Ambulatory Visit: Payer: Self-pay | Admitting: Family Medicine

## 2023-04-13 DIAGNOSIS — H9192 Unspecified hearing loss, left ear: Secondary | ICD-10-CM

## 2023-04-13 DIAGNOSIS — H6992 Unspecified Eustachian tube disorder, left ear: Secondary | ICD-10-CM

## 2023-04-14 DIAGNOSIS — D259 Leiomyoma of uterus, unspecified: Secondary | ICD-10-CM | POA: Diagnosis not present

## 2023-04-14 DIAGNOSIS — R102 Pelvic and perineal pain: Secondary | ICD-10-CM | POA: Diagnosis not present

## 2023-05-24 ENCOUNTER — Other Ambulatory Visit: Payer: Self-pay | Admitting: Family Medicine

## 2023-05-24 NOTE — Telephone Encounter (Signed)
Requested medication (s) are due for refill today: yes  Requested medication (s) are on the active medication list: yes  Last refill:  03/02/22 #90/3  Future visit scheduled: no  Notes to clinic:  pt is due for OV and labs, called, LVMTCB to schedule      Requested Prescriptions  Pending Prescriptions Disp Refills   valsartan (DIOVAN) 160 MG tablet [Pharmacy Med Name: VALSARTAN 160 MG TABLET] 90 tablet 3    Sig: TAKE 1 TABLET BY MOUTH EVERY DAY     Cardiovascular:  Angiotensin Receptor Blockers Failed - 05/24/2023 12:12 AM      Failed - Cr in normal range and within 180 days    Creat  Date Value Ref Range Status  10/09/2021 1.05 (H) 0.50 - 0.99 mg/dL Final         Failed - K in normal range and within 180 days    Potassium  Date Value Ref Range Status  10/09/2021 4.4 3.5 - 5.3 mmol/L Final         Failed - Valid encounter within last 6 months    Recent Outpatient Visits           1 year ago General medical exam   [facility] Donita Brooks, MD   2 years ago LLQ abdominal pain   [facility] Tanya Nones, Priscille Heidelberg, MD   2 years ago LLQ abdominal pain   [facility] Tanya Nones, Priscille Heidelberg, MD   3 years ago General medical exam   [facility] Donita Brooks, MD   4 years ago Subacute frontal sinusitis   [facility] Pickard, Priscille Heidelberg, MD              Passed - Patient is not pregnant      Passed - Last BP in normal range    BP Readings from Last 1 Encounters:  03/29/23 136/82

## 2023-05-25 ENCOUNTER — Other Ambulatory Visit: Payer: Self-pay | Admitting: Nurse Practitioner

## 2023-06-08 DIAGNOSIS — H6522 Chronic serous otitis media, left ear: Secondary | ICD-10-CM | POA: Diagnosis not present

## 2023-06-08 DIAGNOSIS — H6992 Unspecified Eustachian tube disorder, left ear: Secondary | ICD-10-CM | POA: Insufficient documentation

## 2023-06-08 DIAGNOSIS — H9012 Conductive hearing loss, unilateral, left ear, with unrestricted hearing on the contralateral side: Secondary | ICD-10-CM | POA: Diagnosis not present

## 2023-08-11 ENCOUNTER — Other Ambulatory Visit: Payer: Self-pay

## 2023-08-11 ENCOUNTER — Encounter (HOSPITAL_BASED_OUTPATIENT_CLINIC_OR_DEPARTMENT_OTHER): Payer: Self-pay | Admitting: Otolaryngology

## 2023-08-11 NOTE — Progress Notes (Signed)
   08/11/23 0945  Pre-op Phone Call  Surgery Date Verified 08/23/23  Arrival Time Verified 1045  Surgery Location Verified [facility]  Medical History Reviewed Yes  Is the patient taking a GLP-1 receptor agonist? No  Does the patient have diabetes? No diagnosis of diabetes  Do you have a history of heart problems? No  Patient Teaching Enhanced Recovery;Pre / Post Procedure  Patient educated about smoking cessation 24 hours prior to surgery. N/A Non-Smoker  Patient verbalizes understanding of bowel prep? N/A  THA/TKA patients only:  By your surgery date, will you have been taking narcotics for 90 days or greater? No  Med Rec Completed Yes  Take the Following Meds the Morning of Surgery allergy med if needed  Recent  Lab Work, EKG, CXR? No  NPO (Including gum & candy) After midnight  Allowed clear liquids Water;Black Coffee Only (no creamer, milk or cream including half and half);Gatorade  (diabetics please choose diet or no sugar options)  Patient instructed to stop clear liquids including Carb loading drink at: 0900  Stop Solids, Milk, Candy, and Gum STARTING AT MIDNIGHT  Did patient view EMMI videos? No  Responsible adult to drive and be with you for 24 hours? Yes  Name & Phone Number for Ride/Caregiver Husband dee  No Jewelry, money, nail polish or make-up.  No lotions, powders, perfumes. No shaving  48 hrs. prior to surgery. Yes  Contacts, Dentures & Glasses Will Have to be Removed Before OR. Yes  Please bring your ID and Insurance Card the morning of your surgery. (Surgery Centers Only) Yes  Bring any papers or x-rays with you that your surgeon gave you. Yes  Instructed to contact the location of procedure/ provider if they or anyone in their household develops symptoms or tests positive for COVID-19, has close contact with someone who tests positive for COVID, or has known exposure to any contagious illness. Yes  Call this number the morning of surgery  with any problems that may cancel  your surgery. [phone]  Covid-19 Assessment  Have you had a positive COVID-19 test within the previous 90 days? No  COVID Testing Guidance Proceed with the additional questions.  Patient's surgery required a COVID-19 test (cardiothoracic, complex ENT, and bronchoscopies/ EBUS) No  Have you been unmasked and in close contact with anyone with COVID-19 or COVID-19 symptoms within the past 10 days? No  Do you or anyone in your household currently have any COVID-19 symptoms? No

## 2023-08-13 ENCOUNTER — Encounter (HOSPITAL_BASED_OUTPATIENT_CLINIC_OR_DEPARTMENT_OTHER)
Admission: RE | Admit: 2023-08-13 | Discharge: 2023-08-13 | Disposition: A | Discharge status: Home / Self Care | Payer: BC Managed Care – PPO | Source: Ambulatory Visit | Attending: Otolaryngology | Admitting: Otolaryngology

## 2023-08-13 DIAGNOSIS — Z01810 Encounter for preprocedural cardiovascular examination: Secondary | ICD-10-CM | POA: Diagnosis not present

## 2023-08-13 DIAGNOSIS — Z01818 Encounter for other preprocedural examination: Secondary | ICD-10-CM | POA: Diagnosis not present

## 2023-08-17 NOTE — H&P (Signed)
HPI:   Chief Complaint  Patient presents with  Hearing Loss   Ashley Wagner is a pleasant 47 y.o. female who presents as a new patient for left-sided hearing loss. She began having problems with this ear in June of this year after flying to [place]. The hearing was coming and going in the left ear. This seemed to improve after medical therapy but then in early August the ear stopped up again. Despite antibiotic therapy, autoinflation exercises and use of nasal sprays, the hearing has not improved. She has clear nasal drainage from the left side. No recent audiogram.   Denies otalgia, otorrhea, ear pruritus, tinnitus or vertigo. No nasal obstruction, sore throat or dysphagia.   No prior history of recurrent ear infections, ear surgeries or significant noise exposures.   Non-smoker.   PMH/Meds/All/SocHx/FamHx/ROS:   History reviewed. No pertinent past medical history.  History reviewed. No pertinent surgical history.  No family history of bleeding disorders, wound healing problems or difficulty with anesthesia.     Current Outpatient Medications:  ibuprofen (MOTRIN) 200 mg tablet, Take 200-600 mg by mouth., Disp: , Rfl:  triamcinolone acetonide (NASACORT AQ) 55 mcg spra spray, PLACE 2 SPRAYS INTO THE NOSE DAILY., Disp: , Rfl:  valACYclovir (VALTREX) 500 mg tablet, Take 500 mg by mouth., Disp: , Rfl:  valsartan (DIOVAN) 160 mg tablet, Take 160 mg by mouth daily., Disp: , Rfl:   A complete ROS was performed with pertinent positives/negatives noted in the HPI. The remainder of the ROS are negative.   Physical Exam:   Temp 97.5 F (36.4 C) (Temporal)  Resp 16  Ht 1.727 m (5\' 8" )  Wt 103 kg (227 lb 12.8 oz)  BMI 34.64 kg/m   General Awake, at baseline alertness during examination.  Eyes No scleral icterus or conjunctival hemorrhage. Globe position appears normal. EOMI.  Right Ear EAC patent, TM intact w/o inflammation. Middle ear well aerated.  Left Ear EAC patent, TM intact w/o  inflammation. Serous middle ear effusion.   Weber lateralizes to the left x 2. AC>BC for both ears (512Hz  tuning fork).  Nose Patent, no polyps or masses seen on anterior rhinoscopy.  Oral cavity No mucosal lesions or tumors seen. Tongue midline.  Oropharynx Symmetric tonsils.  Neck No abnormal cervical lymphadenopathy. No thyromegaly. No thyroid masses palpated.  Cardio-vascular No cyanosis.  Pulmonary No audible stridor. Breathing easily with no labor.  Neuro Symmetric facial movement.  Psychiatry Appropriate affect and mood for clinic visit.   Independent Review of Additional Tests or Records:   External medical records - [facility], 03/29/23  Procedures:   Nasal Endoscopy  Technique: The procedure was discussed including risks and complications. Questions were answered. Informed consent was obtained.   After anesthetizing the nasal cavity with topical lidocaine and oxymetazoline, the flexible endoscope was introduced and passed through the left nasal cavity into the nasopharynx. Nasopharynx is normal.   She tolerated the procedure well.   ______________________________________________________  Audiometry: pure tone audiogram demonstrates normal hearing in the right ear across all frequencies. Left ear with normal hearing at 250Hz , decreasing to a mild to moderate conductive hearing loss in the low to mid frequencies. Moderately-severe loss in the high frequencies.  Word understanding: 100 right, 84% left.   Tympanometry: type A right, type B left (normal ECV of 0.8 both ears).   Impression & Plans:  Ashley Wagner is a 47 y.o. female with left sided chronic serous otitis media. No improvement after 2-3 months despite medical therapy,  topical nasal sprays and autoinflation exercises. Fiberoptic exam of the nasopharynx is normal. Discussed results of the audiogram demonstrating a mild to moderate conductive hearing loss and moderately-severe loss in the high frequencies  in the left ear. Options include continued medical management versus in-office tympanostomy tube placement. Patient elects to have a tube placed; we discussed the procedure and water precautions going forward. Appointment made with Dr. Pollyann Kennedy.

## 2023-08-23 ENCOUNTER — Ambulatory Visit (HOSPITAL_BASED_OUTPATIENT_CLINIC_OR_DEPARTMENT_OTHER): Payer: Self-pay | Admitting: Anesthesiology

## 2023-08-23 ENCOUNTER — Encounter (HOSPITAL_BASED_OUTPATIENT_CLINIC_OR_DEPARTMENT_OTHER): Payer: Self-pay | Admitting: Otolaryngology

## 2023-08-23 ENCOUNTER — Ambulatory Visit (HOSPITAL_BASED_OUTPATIENT_CLINIC_OR_DEPARTMENT_OTHER)
Admission: RE | Admit: 2023-08-23 | Discharge: 2023-08-23 | Disposition: A | Discharge status: Home / Self Care | Payer: BC Managed Care – PPO | Attending: Otolaryngology | Admitting: Otolaryngology

## 2023-08-23 ENCOUNTER — Encounter (HOSPITAL_BASED_OUTPATIENT_CLINIC_OR_DEPARTMENT_OTHER)
Admission: RE | Disposition: A | Discharge status: Home / Self Care | Payer: Self-pay | Source: Home / Self Care | Attending: Otolaryngology

## 2023-08-23 DIAGNOSIS — E669 Obesity, unspecified: Secondary | ICD-10-CM | POA: Insufficient documentation

## 2023-08-23 DIAGNOSIS — Z6834 Body mass index (BMI) 34.0-34.9, adult: Secondary | ICD-10-CM | POA: Insufficient documentation

## 2023-08-23 DIAGNOSIS — Z01818 Encounter for other preprocedural examination: Secondary | ICD-10-CM

## 2023-08-23 DIAGNOSIS — I10 Essential (primary) hypertension: Secondary | ICD-10-CM | POA: Diagnosis not present

## 2023-08-23 DIAGNOSIS — Z87891 Personal history of nicotine dependence: Secondary | ICD-10-CM | POA: Insufficient documentation

## 2023-08-23 DIAGNOSIS — H9012 Conductive hearing loss, unilateral, left ear, with unrestricted hearing on the contralateral side: Secondary | ICD-10-CM | POA: Insufficient documentation

## 2023-08-23 DIAGNOSIS — H6992 Unspecified Eustachian tube disorder, left ear: Secondary | ICD-10-CM | POA: Diagnosis not present

## 2023-08-23 DIAGNOSIS — K219 Gastro-esophageal reflux disease without esophagitis: Secondary | ICD-10-CM | POA: Diagnosis not present

## 2023-08-23 DIAGNOSIS — H6982 Other specified disorders of Eustachian tube, left ear: Secondary | ICD-10-CM | POA: Diagnosis not present

## 2023-08-23 DIAGNOSIS — H6522 Chronic serous otitis media, left ear: Secondary | ICD-10-CM | POA: Insufficient documentation

## 2023-08-23 HISTORY — PX: MYRINGOTOMY WITH TUBE PLACEMENT: SHX5663

## 2023-08-23 LAB — POCT PREGNANCY, URINE: Preg Test, Ur: NEGATIVE

## 2023-08-23 SURGERY — MYRINGOTOMY WITH TUBE PLACEMENT
Anesthesia: General | Site: Ear | Laterality: Left

## 2023-08-23 MED ORDER — FENTANYL CITRATE (PF) 100 MCG/2ML IJ SOLN
INTRAMUSCULAR | Status: AC
  Filled 2023-08-23: qty 2

## 2023-08-23 MED ORDER — MIDAZOLAM HCL 2 MG/2ML IJ SOLN
INTRAMUSCULAR | Status: AC
Start: 2023-08-23 — End: ?
  Filled 2023-08-23: qty 2

## 2023-08-23 MED ORDER — ACETAMINOPHEN 500 MG PO TABS
1000.0000 mg | ORAL_TABLET | Freq: Once | ORAL | Status: AC
  Administered 2023-08-23: 1000 mg via ORAL

## 2023-08-23 MED ORDER — ONDANSETRON HCL 4 MG/2ML IJ SOLN
INTRAMUSCULAR | Status: AC
  Filled 2023-08-23: qty 2

## 2023-08-23 MED ORDER — PROPOFOL 10 MG/ML IV BOLUS
INTRAVENOUS | Status: AC
  Filled 2023-08-23: qty 20

## 2023-08-23 MED ORDER — FENTANYL CITRATE (PF) 100 MCG/2ML IJ SOLN
INTRAMUSCULAR | Status: DC | PRN
  Administered 2023-08-23: 50 ug via INTRAVENOUS

## 2023-08-23 MED ORDER — DEXAMETHASONE SODIUM PHOSPHATE 10 MG/ML IJ SOLN
INTRAMUSCULAR | Status: AC
  Filled 2023-08-23: qty 1

## 2023-08-23 MED ORDER — SODIUM CHLORIDE 0.9 % IV SOLN: INTRAVENOUS | Status: DC | PRN

## 2023-08-23 MED ORDER — ONDANSETRON HCL 4 MG/2ML IJ SOLN
INTRAMUSCULAR | Status: DC | PRN
  Administered 2023-08-23: 4 mg via INTRAVENOUS

## 2023-08-23 MED ORDER — LIDOCAINE 2% (20 MG/ML) 5 ML SYRINGE
INTRAMUSCULAR | Status: DC | PRN
  Administered 2023-08-23: 80 mg via INTRAVENOUS

## 2023-08-23 MED ORDER — PROPOFOL 10 MG/ML IV BOLUS
INTRAVENOUS | Status: DC | PRN
  Administered 2023-08-23: 170 mg via INTRAVENOUS

## 2023-08-23 MED ORDER — SCOPOLAMINE 1 MG/3DAYS TD PT72
MEDICATED_PATCH | TRANSDERMAL | Status: AC
  Filled 2023-08-23: qty 1

## 2023-08-23 MED ORDER — LIDOCAINE 2% (20 MG/ML) 5 ML SYRINGE
INTRAMUSCULAR | Status: AC
  Filled 2023-08-23: qty 5

## 2023-08-23 MED ORDER — ACETAMINOPHEN 500 MG PO TABS
ORAL_TABLET | ORAL | Status: AC
  Filled 2023-08-23: qty 2

## 2023-08-23 MED ORDER — LACTATED RINGERS IV SOLN: INTRAVENOUS | Status: DC

## 2023-08-23 MED ORDER — DEXAMETHASONE SODIUM PHOSPHATE 4 MG/ML IJ SOLN
INTRAMUSCULAR | Status: DC | PRN
  Administered 2023-08-23: 10 mg via INTRAVENOUS

## 2023-08-23 MED ORDER — MIDAZOLAM HCL 5 MG/5ML IJ SOLN
INTRAMUSCULAR | Status: DC | PRN
  Administered 2023-08-23: 2 mg via INTRAVENOUS

## 2023-08-23 MED ORDER — CIPROFLOXACIN-DEXAMETHASONE 0.3-0.1 % OT SUSP
OTIC | Status: DC | PRN
  Administered 2023-08-23: 4 [drp] via OTIC

## 2023-08-23 MED ORDER — SCOPOLAMINE 1 MG/3DAYS TD PT72
1.0000 | MEDICATED_PATCH | Freq: Once | TRANSDERMAL | Status: DC
  Administered 2023-08-23: 1.5 mg via TRANSDERMAL

## 2023-08-23 SURGICAL SUPPLY — 7 items
CANISTER SUCT 1200ML W/VALVE (MISCELLANEOUS) ×2 IMPLANT
COTTONBALL LRG STERILE PKG (GAUZE/BANDAGES/DRESSINGS) ×2 IMPLANT
GLOVE BIOGEL PI IND STRL 7.0 (GLOVE) ×1 IMPLANT
TOWEL GREEN STERILE FF (TOWEL DISPOSABLE) ×2 IMPLANT
TUBE CONNECTING 20X1/4 (TUBING) ×2 IMPLANT
TUBE EAR PAPARELLA TYPE 1 (OTOLOGIC RELATED) ×3 IMPLANT
TUBE EAR T MOD 1.32X4.8 BL (OTOLOGIC RELATED) IMPLANT

## 2023-08-23 NOTE — Discharge Instructions (Addendum)
Use the supplied eardrops, 3 drops in each ear, 3 times each day for 3 days. The first dose has already been given during surgery. Keep any remainders as you may need them in the future.  No tylenol until 5:50 p.m.   Post Anesthesia Home Care Instructions  Activity: Get plenty of rest for the remainder of the day. A responsible individual must stay with you for 24 hours following the procedure.  For the next 24 hours, DO NOT: -Drive a car -Advertising copywriter -Drink alcoholic beverages -Take any medication unless instructed by your physician -Make any legal decisions or sign important papers.  Meals: Start with liquid foods such as gelatin or soup. Progress to regular foods as tolerated. Avoid greasy, spicy, heavy foods. If nausea and/or vomiting occur, drink only clear liquids until the nausea and/or vomiting subsides. Call your physician if vomiting continues.  Special Instructions/Symptoms: Your throat may feel dry or sore from the anesthesia or the breathing tube placed in your throat during surgery. If this causes discomfort, gargle with warm salt water. The discomfort should disappear within 24 hours.  If you had a scopolamine patch placed behind your ear for the management of post- operative nausea and/or vomiting:  1. The medication in the patch is effective for 72 hours, after which it should be removed.  Wrap patch in a tissue and discard in the trash. Wash hands thoroughly with soap and water. 2. You may remove the patch earlier than 72 hours if you experience unpleasant side effects which may include dry mouth, dizziness or visual disturbances. 3. Avoid touching the patch. Wash your hands with soap and water after contact with the patch.

## 2023-08-23 NOTE — Anesthesia Preprocedure Evaluation (Addendum)
Anesthesia Evaluation  Patient identified by MRN, date of birth, ID band Patient awake    Reviewed: Allergy & Precautions, NPO status , Patient's Chart, lab work & pertinent test results  Airway Mallampati: I  TM Distance: >3 FB Neck ROM: Full    Dental  (+) Dental Advisory Given, Partial Upper, Missing   Pulmonary former smoker   Pulmonary exam normal breath sounds clear to auscultation       Cardiovascular hypertension, Pt. on medications Normal cardiovascular exam Rhythm:Regular Rate:Normal     Neuro/Psych negative neurological ROS  negative psych ROS   GI/Hepatic Neg liver ROS,GERD  Medicated,,  Endo/Other  Obesity   Renal/GU negative Renal ROS     Musculoskeletal negative musculoskeletal ROS (+)    Abdominal   Peds  Hematology negative hematology ROS (+)   Anesthesia Other Findings Day of surgery medications reviewed with the patient.  Conductive hearing loss of left ear with unrestricted hearing of right ear.  Dysfunction of left eustachian tube. Chronic serous otitis media, left ear    Reproductive/Obstetrics                             Anesthesia Physical Anesthesia Plan  ASA: 2  Anesthesia Plan: General   Post-op Pain Management: Tylenol PO (pre-op)*   Induction: Intravenous  PONV Risk Score and Plan: 4 or greater and Midazolam, Dexamethasone, Ondansetron and Scopolamine patch - Pre-op  Airway Management Planned: LMA  Additional Equipment:   Intra-op Plan:   Post-operative Plan: Extubation in OR  Informed Consent: I have reviewed the patients History and Physical, chart, labs and discussed the procedure including the risks, benefits and alternatives for the proposed anesthesia with the patient or authorized representative who has indicated his/her understanding and acceptance.     Dental advisory given  Plan Discussed with: CRNA  Anesthesia Plan Comments:          Anesthesia Quick Evaluation

## 2023-08-23 NOTE — Interval H&P Note (Signed)
History and Physical Interval Note:  08/23/2023 11:11 AM  Ashley Wagner  has presented today for surgery, with the diagnosis of Conductive hearing loss of left ear with unrestricted hearing of right ear  Dysfunction of left eustachian tube  Chronic serous otitis media, left ear.  The various methods of treatment have been discussed with the patient and family. After consideration of risks, benefits and other options for treatment, the patient has consented to  Procedure(s): MYRINGOTOMY (Left) as a surgical intervention.  The patient's history has been reviewed, patient examined, no change in status, stable for surgery.  I have reviewed the patient's chart and labs.  Questions were answered to the patient's satisfaction.     Serena Colonel

## 2023-08-23 NOTE — Anesthesia Postprocedure Evaluation (Signed)
Anesthesia Post Note  Patient: Ashley Wagner  Procedure(s) Performed: LEFT MYRINGOTOMY WITH TUBE PLACEMENT (Left: Ear)     Patient location during evaluation: PACU Anesthesia Type: General Level of consciousness: awake and alert Pain management: pain level controlled Vital Signs Assessment: post-procedure vital signs reviewed and stable Respiratory status: spontaneous breathing, nonlabored ventilation and respiratory function stable Cardiovascular status: blood pressure returned to baseline and stable Postop Assessment: no apparent nausea or vomiting Anesthetic complications: no   No notable events documented.  Last Vitals:  Vitals:   08/23/23 1200 08/23/23 1210  BP: (!) 143/82 (!) 156/94  Pulse: 77 69  Resp: (!) 22 18  Temp:  36.5 C  SpO2: 99% 99%    Last Pain:  Vitals:   08/23/23 1210  TempSrc:   PainSc: 0-No pain                 Collene Schlichter

## 2023-08-23 NOTE — Transfer of Care (Signed)
Immediate Anesthesia Transfer of Care Note  Patient: Ashley Wagner  Procedure(s) Performed: LEFT MYRINGOTOMY WITH TUBE PLACEMENT (Left: Ear)  Patient Location: PACU  Anesthesia Type:General  Level of Consciousness: awake, alert , and oriented  Airway & Oxygen Therapy: Patient Spontanous Breathing and Patient connected to face mask oxygen  Post-op Assessment: Report given to RN and Post -op Vital signs reviewed and stable  Post vital signs: Reviewed and stable  Last Vitals:  Vitals Value Taken Time  BP 135/78 08/23/23 1133  Temp    Pulse 85 08/23/23 1134  Resp 26 08/23/23 1134  SpO2 100 % 08/23/23 1134  Vitals shown include unfiled device data.  Last Pain:  Vitals:   08/23/23 1049  TempSrc: Temporal  PainSc: 0-No pain      Patients Stated Pain Goal: 0 (08/23/23 1049)  Complications: No notable events documented.

## 2023-08-23 NOTE — Op Note (Signed)
08/23/2023  11:24 AM  PATIENT:  Ashley Wagner  47 y.o. female  PRE-OPERATIVE DIAGNOSIS:  Conductive hearing loss of left ear with unrestricted hearing of right ear  Dysfunction of left eustachian tube  Chronic serous otitis media, left ear  POST-OPERATIVE DIAGNOSIS:  * No post-op diagnosis entered *  PROCEDURE:  Procedure(s): LEFT MYRINGOTOMY WITH TUBE PLACEMENT  SURGEON:  Surgeon(s): Serena Colonel, MD  ANESTHESIA:   Mask inhalation  COUNTS:  Correct   DICTATION: The patient was taken to the operating room and placed on the operating table in the supine position. Following induction of mask inhalation anesthesia, the left ear was inspected using the operating microscope. Anterior/inferior myringotomy incision was created, serous effusion was aspirated . Paparella type I tube was placed without difficulty, Ciprodex drops were instilled into the ear canal. Cottonball was placed . The patient was then awakened from anesthesia and transferred to PACU in stable condition.   PATIENT DISPOSITION:  To PACU stable

## 2023-08-23 NOTE — Anesthesia Procedure Notes (Signed)
Procedure Name: LMA Insertion Date/Time: 08/23/2023 11:18 AM  Performed by: Burna Cash, CRNAPre-anesthesia Checklist: Patient identified, Emergency Drugs available, Suction available and Patient being monitored Patient Re-evaluated:Patient Re-evaluated prior to induction Oxygen Delivery Method: Circle system utilized Preoxygenation: Pre-oxygenation with 100% oxygen Induction Type: IV induction Ventilation: Mask ventilation without difficulty LMA: LMA inserted LMA Size: 4.0 Number of attempts: 1 Airway Equipment and Method: Bite block Placement Confirmation: positive ETCO2 Tube secured with: Tape Dental Injury: Teeth and Oropharynx as per pre-operative assessment

## 2023-08-24 ENCOUNTER — Encounter (HOSPITAL_BASED_OUTPATIENT_CLINIC_OR_DEPARTMENT_OTHER): Payer: Self-pay | Admitting: Otolaryngology

## 2023-09-09 ENCOUNTER — Ambulatory Visit (INDEPENDENT_AMBULATORY_CARE_PROVIDER_SITE_OTHER): Payer: BC Managed Care – PPO | Admitting: Family Medicine

## 2023-09-09 VITALS — BP 128/82 | HR 88 | Temp 98.5°F | Ht 68.0 in | Wt 224.0 lb

## 2023-09-09 DIAGNOSIS — Z0001 Encounter for general adult medical examination with abnormal findings: Secondary | ICD-10-CM | POA: Diagnosis not present

## 2023-09-09 DIAGNOSIS — F411 Generalized anxiety disorder: Secondary | ICD-10-CM

## 2023-09-09 DIAGNOSIS — Z23 Encounter for immunization: Secondary | ICD-10-CM

## 2023-09-09 DIAGNOSIS — Z0000 Encounter for general adult medical examination without abnormal findings: Secondary | ICD-10-CM | POA: Diagnosis not present

## 2023-09-09 DIAGNOSIS — I10 Essential (primary) hypertension: Secondary | ICD-10-CM

## 2023-09-09 MED ORDER — CYCLOBENZAPRINE HCL 10 MG PO TABS: 10.0000 mg | ORAL_TABLET | Freq: Three times a day (TID) | ORAL | 0 refills | Status: AC | PRN

## 2023-09-09 MED ORDER — ESCITALOPRAM OXALATE 10 MG PO TABS: 10.0000 mg | ORAL_TABLET | Freq: Every day | ORAL | 5 refills | Status: DC

## 2023-09-09 NOTE — Progress Notes (Signed)
Subjective:    Patient ID: Ashley Wagner, female    DOB: 28-Mar-1976, 48 y.o.   MRN: [id number]  HPI  Patient is a very pleasant 48 year old African-American female who presents today for complete physical exam.  Last colonoscopy was 2022 and they recommended 5 years due to history of colon cancer in father.  Patient's mammogram and Pap smear are performed through her gynecologist.  She is due for a flu shot today.  She is having some mild right-sided low back pain right above her gluteus.  She is tender to palpation in that area.  She denies any sciatica.  She denies any dysuria.  I am able to reproduce the pain by palpation of the lumbar paraspinal muscles.  There is no pain with range of motion in the right hip.  This seems to be a lumbar muscle strain.  She also has some mild onychomycosis in the right great toenail.  She defers treating this at the present time.  She is dealing with generalized anxiety.  She reports feeling anxious the majority of the time.  Showing some mild depression.  She is having low-grade panic attacks.  She is requesting medicine to help treat this.  This has been going on for months.  She feels that it stems from all the responsibility that she has.    Past Medical History:  Diagnosis Date   Diverticulosis of colon    GERD (gastroesophageal reflux disease)    on meds- and with certain foods   History of cervical dysplasia 2003   CIN I and II  s/p LEEP   History of chlamydia 2000   treated   HSV-2 infection    Hypertension    on meds   Menorrhagia    Uterine fibroid    Wears partial dentures    upper   Past Surgical History:  Procedure Laterality Date   CESAREAN SECTION  2004   COLONOSCOPY WITH ESOPHAGOGASTRODUODENOSCOPY (EGD)  2017  dr Marina Goodell   DILATATION & CURETTAGE/HYSTEROSCOPY WITH TRUECLEAR N/A 09/07/2014   Procedure: DILATATION & CURETTAGE/HYSTEROSCOPY WITH TRUCLEAR;  Surgeon: Jaymes Graff, MD;  Location: [facility];  Service: Gynecology;  Laterality:  N/A;   DILITATION & CURRETTAGE/HYSTROSCOPY WITH NOVASURE ABLATION N/A 01/09/2021   Procedure: HYSTEROSCOPY WITH HYDROTHERMAL ABLATION;  Surgeon: Jaymes Graff, MD;  Location: [facility];  Service: Gynecology;  Laterality: N/A;   LAPAROSCOPIC BILATERAL SALPINGECTOMY Bilateral 01/09/2021   Procedure: LAPAROSCOPIC BILATERAL SALPINGECTOMY;  Surgeon: Jaymes Graff, MD;  Location: [facility];  Service: Gynecology;  Laterality: Bilateral;   LAPAROSCOPIC CHOLECYSTECTOMY  07/30/15   LEEP  2003   MYRINGOTOMY WITH TUBE PLACEMENT Left 08/23/2023   Procedure: LEFT MYRINGOTOMY WITH TUBE PLACEMENT;  Surgeon: Serena Colonel, MD;  Location: [facility];  Service: ENT;  Laterality: Left;   Current Outpatient Medications on File Prior to Visit  Medication Sig Dispense Refill   ibuprofen (ADVIL) 200 MG tablet Take 200-600 mg by mouth as needed.     triamcinolone (NASACORT) 55 MCG/ACT AERO nasal inhaler Place 2 sprays into the nose daily. 1 each 12   valACYclovir (VALTREX) 500 MG tablet Take 500 mg by mouth daily as needed (outbreaks).      valsartan (DIOVAN) 160 MG tablet Take 1 tablet (160 mg total) by mouth daily. 90 tablet 3   No current facility-administered medications on file prior to visit.   No Known Allergies Social History   Socioeconomic History   Marital status: Married    Spouse  name: Not on file   Number of children: 1   Years of education: Not on file   Highest education level: Not on file  Occupational History   Occupation: Retail banker  Tobacco Use   Smoking status: Former    Current packs/day: 0.00    Types: Cigarettes    Start date: 08/27/1997    Quit date: 08/27/2001    Years since quitting: 22.0   Smokeless tobacco: Never  Vaping Use   Vaping status: Never Used  Substance and Sexual Activity   Alcohol use: Yes    Alcohol/week: 1.0 standard drink of alcohol    Types: 1 Standard drinks or equivalent per week    Comment:  occasional   Drug use: Never   Sexual activity: Yes    Partners: Male    Birth control/protection: Pill  Other Topics Concern   Not on file  Social History Narrative   Not on file   Social Drivers of Health   Financial Resource Strain: Not on file  Food Insecurity: Low Risk  (06/08/2023)   Received from [facility]   Hunger Vital Sign    Worried About Running Out of Food in the Last Year: Never true    Ran Out of Food in the Last Year: Never true  Transportation Needs: No Transportation Needs (06/08/2023)   Received from [facility]    In the past 12 months, has lack of reliable transportation kept you from medical appointments, meetings, work or from getting things needed for daily living? : No  Physical Activity: Not on file  Stress: Not on file  Social Connections: Not on file  Intimate Partner Violence: Not on file      Review of Systems  All other systems reviewed and are negative.      Objective:   Physical Exam Vitals reviewed.  Constitutional:      General: She is not in acute distress.    Appearance: She is well-developed. She is not diaphoretic.  HENT:     Head: Normocephalic and atraumatic.     Right Ear: External ear normal.     Left Ear: External ear normal.     Nose: Nose normal.     Mouth/Throat:     Pharynx: No oropharyngeal exudate.  Eyes:     General: No scleral icterus.       Right eye: No discharge.        Left eye: No discharge.     Conjunctiva/sclera: Conjunctivae normal.     Pupils: Pupils are equal, round, and reactive to light.  Neck:     Thyroid: No thyromegaly.     Vascular: No JVD.     Trachea: No tracheal deviation.  Cardiovascular:     Rate and Rhythm: Normal rate and regular rhythm.     Heart sounds: Normal heart sounds. No murmur heard.    No friction rub.  Pulmonary:     Effort: Pulmonary effort is normal. No respiratory distress.     Breath sounds: Normal breath sounds. No stridor. No wheezing or  rales.  Chest:     Chest wall: No tenderness.  Abdominal:     General: Bowel sounds are normal. There is no distension.     Palpations: Abdomen is soft. There is no mass.     Tenderness: There is no guarding or rebound.  Musculoskeletal:        General: No tenderness or deformity. Normal range of motion.  Cervical back: Normal range of motion and neck supple.  Lymphadenopathy:     Cervical: No cervical adenopathy.  Skin:    General: Skin is warm.     Coloration: Skin is not pale.     Findings: No erythema or rash.  Neurological:     Mental Status: She is alert and oriented to person, place, and time.     Cranial Nerves: No cranial nerve deficit.     Motor: No abnormal muscle tone.     Coordination: Coordination normal.     Deep Tendon Reflexes: Reflexes are normal and symmetric. Reflexes normal.  Psychiatric:        Behavior: Behavior normal.        Thought Content: Thought content normal.        Judgment: Judgment normal.           Assessment & Plan:  General medical exam - Plan: CBC with Differential/Platelet, COMPLETE METABOLIC PANEL WITH GFR, Lipid panel  Primary hypertension - Plan: CBC with Differential/Platelet, COMPLETE METABOLIC PANEL WITH GFR, Lipid panel, CT CARDIAC SCORING (SELF PAY ONLY)  GAD (generalized anxiety disorder) Patient's blood pressure today is excellent.  She will get her flu shot.  Colonoscopy is up-to-date.  I will defer her mammogram and Pap smear to her gynecologist.  We discussed a coronary artery calcium score.  She would like to schedule this.  She has a strong family history of coronary artery disease in her paternal and maternal grandparents.  Her cholesterol has always been outstanding.  We will try Lexapro 10 mg a day for generalized anxiety disorder and reassess in 4 to 6 weeks.  Patient received her flu shot today.

## 2023-09-10 LAB — COMPLETE METABOLIC PANEL WITH GFR
AG Ratio: 1.4 (calc) (ref 1.0–2.5)
ALT: 11 U/L (ref 6–29)
AST: 18 U/L (ref 10–35)
Albumin: 4.4 g/dL (ref 3.6–5.1)
Alkaline phosphatase (APISO): 59 U/L (ref 31–125)
BUN: 13 mg/dL (ref 7–25)
CO2: 26 mmol/L (ref 20–32)
Calcium: 9.7 mg/dL (ref 8.6–10.2)
Chloride: 104 mmol/L (ref 98–110)
Creat: 0.95 mg/dL (ref 0.50–0.99)
Globulin: 3.1 g/dL (ref 1.9–3.7)
Glucose, Bld: 84 mg/dL (ref 65–99)
Potassium: 4.4 mmol/L (ref 3.5–5.3)
Sodium: 138 mmol/L (ref 135–146)
Total Bilirubin: 0.4 mg/dL (ref 0.2–1.2)
Total Protein: 7.5 g/dL (ref 6.1–8.1)
eGFR: 74 mL/min/{1.73_m2} (ref >=60)

## 2023-09-10 LAB — CBC WITH DIFFERENTIAL/PLATELET
Absolute Lymphocytes: 2300 {cells}/uL (ref 850–3900)
Absolute Monocytes: 518 {cells}/uL (ref 200–950)
Basophils Absolute: 43 {cells}/uL (ref 0–200)
Basophils Relative: 0.6 %
Eosinophils Absolute: 256 {cells}/uL (ref 15–500)
Eosinophils Relative: 3.6 %
HCT: 41.7 % (ref 35.0–45.0)
Hemoglobin: 13.4 g/dL (ref 11.7–15.5)
MCH: 30.8 pg (ref 27.0–33.0)
MCHC: 32.1 g/dL (ref 32.0–36.0)
MCV: 95.9 fL (ref 80.0–100.0)
MPV: 10 fL (ref 7.5–12.5)
Monocytes Relative: 7.3 %
Neutro Abs: 3983 {cells}/uL (ref 1500–7800)
Neutrophils Relative %: 56.1 %
Platelets: 332 10*3/uL (ref 140–400)
RBC: 4.35 10*6/uL (ref 3.80–5.10)
RDW: 11.9 % (ref 11.0–15.0)
Total Lymphocyte: 32.4 %
WBC: 7.1 10*3/uL (ref 3.8–10.8)

## 2023-09-10 LAB — LIPID PANEL
Cholesterol: 194 mg/dL (ref <200)
HDL: 81 mg/dL (ref >=50)
LDL Cholesterol (Calc): 99 mg/dL
Non-HDL Cholesterol (Calc): 113 mg/dL (ref <130)
Total CHOL/HDL Ratio: 2.4 (calc) (ref <5.0)
Triglycerides: 47 mg/dL (ref <150)

## 2023-09-10 NOTE — Addendum Note (Signed)
Addended by: Arta Silence on: 09/10/2023 08:16 AM   Modules accepted: Orders

## 2023-09-16 ENCOUNTER — Encounter: Payer: Self-pay | Admitting: Family Medicine

## 2023-09-30 DIAGNOSIS — H6992 Unspecified Eustachian tube disorder, left ear: Secondary | ICD-10-CM | POA: Diagnosis not present

## 2023-10-02 ENCOUNTER — Other Ambulatory Visit: Payer: Self-pay | Admitting: Family Medicine

## 2023-10-07 ENCOUNTER — Other Ambulatory Visit: Payer: Self-pay

## 2023-10-07 ENCOUNTER — Encounter: Payer: Self-pay | Admitting: Family Medicine

## 2023-10-07 ENCOUNTER — Telehealth: Payer: Self-pay | Admitting: Family Medicine

## 2023-10-07 DIAGNOSIS — I10 Essential (primary) hypertension: Secondary | ICD-10-CM

## 2023-10-07 MED ORDER — VALSARTAN 160 MG PO TABS: 160.0000 mg | ORAL_TABLET | Freq: Every day | ORAL | 3 refills | Status: DC

## 2023-10-07 NOTE — Telephone Encounter (Signed)
Prescription Request  10/07/2023  LOV: 09/09/2023  What is the name of the medication or equipment?   valsartan (DIOVAN) 160 MG tablet  **original script was for 90 days**  Have you contacted your pharmacy to request a refill? Yes   Which pharmacy would you like this sent to?  [id number] - [place], [place] - [address] AT [facility] [address] [place] [address] Phone: [phone] Fax: [phone]    Patient notified that their request is being sent to the clinical staff for review and that they should receive a response within 2 business days.   Please advise pharmacist.

## 2023-10-25 ENCOUNTER — Encounter: Payer: Self-pay | Admitting: Family Medicine

## 2023-11-08 ENCOUNTER — Encounter: Payer: Self-pay | Admitting: Family Medicine

## 2023-11-08 ENCOUNTER — Other Ambulatory Visit: Payer: Self-pay | Admitting: Family Medicine

## 2023-11-08 MED ORDER — AMOXICILLIN 875 MG PO TABS: 875.0000 mg | ORAL_TABLET | Freq: Two times a day (BID) | ORAL | 0 refills | Status: DC

## 2023-11-12 ENCOUNTER — Ambulatory Visit: Admitting: Family Medicine

## 2023-11-12 ENCOUNTER — Encounter: Payer: Self-pay | Admitting: Family Medicine

## 2023-11-12 VITALS — BP 142/84 | HR 95 | Temp 98.3°F | Ht 68.0 in | Wt 217.0 lb

## 2023-11-12 DIAGNOSIS — J320 Chronic maxillary sinusitis: Secondary | ICD-10-CM

## 2023-11-12 MED ORDER — AMOXICILLIN-POT CLAVULANATE 875-125 MG PO TABS: 1.0000 | ORAL_TABLET | Freq: Two times a day (BID) | ORAL | 0 refills | Status: AC

## 2023-11-12 MED ORDER — PREDNISONE 20 MG PO TABS: ORAL_TABLET | ORAL | 0 refills | Status: DC

## 2023-11-12 NOTE — Progress Notes (Signed)
 Subjective:    Patient ID: Ashley Wagner, female    DOB: 10-Aug-1976, 48 y.o.   MRN: [id number]  Patient has been dealing with left-sided eustachian tube dysfunction.  She has a tympanostomy tube in place.  Recently she developed pain and pressure in her left frontal sinus and her left maxillary sinus.  She also flew on an airplane.  At which point, the pain intensified in her left frontal sinus.  She also has had nosebleeds recently out of the left nostril.  On examination today she is exquisitely tender to percussion over the left maxillary sinus and the left frontal sinus.  Tympanostomy tube in place in the left ear.  By tympanic membrane.  Normal.  Neurologically, the patient denies any neurologic deficits.  Instead she has a pressure-like pain in her frontal and maxillary sinus. Past Medical History:  Diagnosis Date   Diverticulosis of colon    GERD (gastroesophageal reflux disease)    on meds- and with certain foods   History of cervical dysplasia 2003   CIN I and II  s/p LEEP   History of chlamydia 2000   treated   HSV-2 infection    Hypertension    on meds   Menorrhagia    Uterine fibroid    Wears partial dentures    upper   Past Surgical History:  Procedure Laterality Date   CESAREAN SECTION  2004   COLONOSCOPY WITH ESOPHAGOGASTRODUODENOSCOPY (EGD)  2017  dr Marina Goodell   DILATATION & CURETTAGE/HYSTEROSCOPY WITH TRUECLEAR N/A 09/07/2014   Procedure: DILATATION & CURETTAGE/HYSTEROSCOPY WITH TRUCLEAR;  Surgeon: Jaymes Graff, MD;  Location: [facility];  Service: Gynecology;  Laterality: N/A;   DILITATION & CURRETTAGE/HYSTROSCOPY WITH NOVASURE ABLATION N/A 01/09/2021   Procedure: HYSTEROSCOPY WITH HYDROTHERMAL ABLATION;  Surgeon: Jaymes Graff, MD;  Location: [facility];  Service: Gynecology;  Laterality: N/A;   LAPAROSCOPIC BILATERAL SALPINGECTOMY Bilateral 01/09/2021   Procedure: LAPAROSCOPIC BILATERAL SALPINGECTOMY;  Surgeon: Jaymes Graff, MD;  Location: [facility];  Service: Gynecology;  Laterality: Bilateral;   LAPAROSCOPIC CHOLECYSTECTOMY  07/30/15   LEEP  2003   MYRINGOTOMY WITH TUBE PLACEMENT Left 08/23/2023   Procedure: LEFT MYRINGOTOMY WITH TUBE PLACEMENT;  Surgeon: Serena Colonel, MD;  Location: [facility];  Service: ENT;  Laterality: Left;   Current Outpatient Medications on File Prior to Visit  Medication Sig Dispense Refill   cyclobenzaprine (FLEXERIL) 10 MG tablet Take 1 tablet (10 mg total) by mouth 3 (three) times daily as needed for muscle spasms. 30 tablet 0   escitalopram (LEXAPRO) 10 MG tablet Take 1 tablet (10 mg total) by mouth daily. 30 tablet 5   ibuprofen (ADVIL) 200 MG tablet Take 200-600 mg by mouth as needed.     triamcinolone (NASACORT) 55 MCG/ACT AERO nasal inhaler Place 2 sprays into the nose daily. 1 each 12   valACYclovir (VALTREX) 500 MG tablet Take 500 mg by mouth daily as needed (outbreaks).      valsartan (DIOVAN) 160 MG tablet Take 1 tablet (160 mg total) by mouth daily. 90 tablet 3   No current facility-administered medications on file prior to visit.   No Known Allergies Social History   Socioeconomic History   Marital status: Married    Spouse name: Not on file   Number of children: 1   Years of education: Not on file   Highest education level: Not on file  Occupational History   Occupation: Retail banker  Tobacco Use   Smoking  status: Former    Current packs/day: 0.00    Types: Cigarettes    Start date: 08/27/1997    Quit date: 08/27/2001    Years since quitting: 22.2   Smokeless tobacco: Never  Vaping Use   Vaping status: Never Used  Substance and Sexual Activity   Alcohol use: Yes    Alcohol/week: 1.0 standard drink of alcohol    Types: 1 Standard drinks or equivalent per week    Comment: occasional   Drug use: Never   Sexual activity: Yes    Partners: Male    Birth control/protection: Pill  Other Topics Concern   Not on file  Social History Narrative    Not on file   Social Drivers of Health   Financial Resource Strain: Not on file  Food Insecurity: Low Risk  (06/08/2023)   Received from [facility]   Hunger Vital Sign    Worried About Running Out of Food in the Last Year: Never true    Ran Out of Food in the Last Year: Never true  Transportation Needs: No Transportation Needs (06/08/2023)   Received from [facility]    In the past 12 months, has lack of reliable transportation kept you from medical appointments, meetings, work or from getting things needed for daily living? : No  Physical Activity: Not on file  Stress: Not on file  Social Connections: Not on file  Intimate Partner Violence: Not on file      Review of Systems  Musculoskeletal:  Positive for back pain.  All other systems reviewed and are negative.      Objective:   Physical Exam Vitals reviewed.  Constitutional:      General: She is not in acute distress.    Appearance: She is well-developed. She is not diaphoretic.  HENT:     Head: Normocephalic and atraumatic.     Right Ear: Tympanic membrane, ear canal and external ear normal.     Left Ear: External ear normal.     Nose:     Left Sinus: Maxillary sinus tenderness and frontal sinus tenderness present.     Mouth/Throat:     Pharynx: No oropharyngeal exudate.  Eyes:     General: No scleral icterus.       Right eye: No discharge.        Left eye: No discharge.     Conjunctiva/sclera: Conjunctivae normal.     Pupils: Pupils are equal, round, and reactive to light.  Neck:     Thyroid: No thyromegaly.     Vascular: No JVD.     Trachea: No tracheal deviation.  Cardiovascular:     Rate and Rhythm: Normal rate and regular rhythm.     Heart sounds: Normal heart sounds. No murmur heard.    No friction rub.  Pulmonary:     Effort: Pulmonary effort is normal. No respiratory distress.     Breath sounds: Normal breath sounds. No stridor. No wheezing or rales.  Chest:     Chest  wall: No tenderness.  Musculoskeletal:     Cervical back: Normal range of motion and neck supple.  Lymphadenopathy:     Cervical: No cervical adenopathy.  Skin:    General: Skin is warm.     Coloration: Skin is not pale.     Findings: No erythema or rash.  Neurological:     Mental Status: She is alert and oriented to person, place, and time.     Cranial Nerves: No  cranial nerve deficit.     Motor: No abnormal muscle tone.     Coordination: Coordination normal.     Deep Tendon Reflexes: Reflexes are normal and symmetric.           Assessment & Plan:  Left maxillary sinusitis  Begin Augmentin 875 mg twice daily with a prednisone taper pack.  Reassess in 1 week if no better or sooner if worse

## 2023-11-17 ENCOUNTER — Encounter: Payer: Self-pay | Admitting: Family Medicine

## 2023-11-18 ENCOUNTER — Telehealth: Payer: Self-pay

## 2023-11-18 ENCOUNTER — Other Ambulatory Visit: Payer: Self-pay | Admitting: Family Medicine

## 2023-11-18 DIAGNOSIS — J320 Chronic maxillary sinusitis: Secondary | ICD-10-CM

## 2023-11-18 NOTE — Telephone Encounter (Signed)
 Copied from CRM [id number]. Topic: Clinical - Request for Lab/Test Order >> Nov 18, 2023 10:25 AM Patsy Lager T wrote: Reason for CRM: Ashley Wagner from [facility] called stated the orders for the CT Maxillofacial w/contrast and she need it changed to CT Maxillofacial w/o contrast. Please f/u with Ashley Wagner at [phone] option 1 then option 4.

## 2023-11-19 ENCOUNTER — Telehealth: Payer: Self-pay

## 2023-11-19 NOTE — Telephone Encounter (Signed)
 Copied from CRM [id number]. Topic: General - Other >> Nov 19, 2023  9:45 AM Almira Coaster wrote: Reason for CRM: Kendal Hymen from [facility] is calling because patient is scheduled for a CT scan on 11/23/2023 and they would like to know if the prior authorization is completed. Best call back number is [phone].

## 2023-11-23 ENCOUNTER — Ambulatory Visit
Admission: RE | Admit: 2023-11-23 | Discharge: 2023-11-23 | Discharge status: Home / Self Care | Source: Ambulatory Visit | Attending: Family Medicine | Admitting: Family Medicine

## 2023-11-23 DIAGNOSIS — J320 Chronic maxillary sinusitis: Secondary | ICD-10-CM

## 2023-11-26 ENCOUNTER — Encounter (INDEPENDENT_AMBULATORY_CARE_PROVIDER_SITE_OTHER): Payer: Self-pay | Admitting: Otolaryngology

## 2023-11-26 ENCOUNTER — Other Ambulatory Visit: Payer: Self-pay

## 2023-11-26 DIAGNOSIS — J320 Chronic maxillary sinusitis: Secondary | ICD-10-CM

## 2023-11-29 ENCOUNTER — Encounter (INDEPENDENT_AMBULATORY_CARE_PROVIDER_SITE_OTHER): Payer: Self-pay | Admitting: Otolaryngology

## 2023-11-29 ENCOUNTER — Ambulatory Visit (INDEPENDENT_AMBULATORY_CARE_PROVIDER_SITE_OTHER): Admitting: Otolaryngology

## 2023-11-29 VITALS — BP 157/89 | HR 77 | Ht 68.0 in | Wt 214.0 lb

## 2023-11-29 DIAGNOSIS — T701XXA Sinus barotrauma, initial encounter: Secondary | ICD-10-CM

## 2023-11-29 DIAGNOSIS — Z9109 Other allergy status, other than to drugs and biological substances: Secondary | ICD-10-CM

## 2023-11-29 DIAGNOSIS — J339 Nasal polyp, unspecified: Secondary | ICD-10-CM | POA: Diagnosis not present

## 2023-11-29 DIAGNOSIS — J342 Deviated nasal septum: Secondary | ICD-10-CM

## 2023-11-29 DIAGNOSIS — R519 Headache, unspecified: Secondary | ICD-10-CM | POA: Diagnosis not present

## 2023-11-29 DIAGNOSIS — H6990 Unspecified Eustachian tube disorder, unspecified ear: Secondary | ICD-10-CM

## 2023-11-29 DIAGNOSIS — R0982 Postnasal drip: Secondary | ICD-10-CM

## 2023-11-29 DIAGNOSIS — R0981 Nasal congestion: Secondary | ICD-10-CM

## 2023-11-29 DIAGNOSIS — J343 Hypertrophy of nasal turbinates: Secondary | ICD-10-CM

## 2023-11-29 MED ORDER — CETIRIZINE HCL 10 MG PO TABS: 10.0000 mg | ORAL_TABLET | Freq: Every day | ORAL | 11 refills | Status: AC

## 2023-11-29 NOTE — Progress Notes (Signed)
 ENT CONSULT:  Reason for Consult: sinusitis    HPI: Discussed the use of AI scribe software for clinical note transcription with the patient, who gave verbal consent to proceed.  History of Present Illness Ashley Wagner is a 48 year old female with hx of Eustachian tube dysfunction, s/p left PET under anesthesia, who presents with persistent chronic nasal congestion, nasal drainage and headaches.   She has been experiencing persistent nasal drainage from the left side of her nose, which began after a flight in early March 2025 (went to [place]). The drainage has been continuous for four to five weeks and is accompanied by blood-tinged secretions and headaches, particularly in the back of her head. The headaches are described as a pressure sensation at the back and top of her head. Leaning forward or bending down exacerbates the nasal drainage, often resulting in secretions falling onto her clothing.  She has a history of Eustachian tube dysfunction and had a tube placed in her left ear last year due to pressure issues during flights. The tube has helped, but she occasionally feels fluid in the ear. During her recent flight, she experienced a headache but no ear pressure.  She uses a steroid nasal spray and takes Zyrtec daily for allergies. She previously used Claritin D but reduced its use due to concerns about frequent decongestant use.   Upon return from [place], she was initially treated with amoxicillin, which was later changed to Augmentin and prednisone when symptoms worsened. The prednisone provided some relief, but symptoms persisted after completion. She has not been tested for allergies but assumes she has them. CT sinuses showed some mucosal thickening in left maxillary sinus, otherwise clear paranasal sinuses.  No history of nasal or sinus procedures.  Records Reviewed:  PCP visit 11/12/23 Dr Tanya Nones  Left maxillary sinusitis   Begin Augmentin 875 mg twice daily with a prednisone taper  pack.  Reassess in 1 week if no better or sooner if worse   Past Medical History:  Diagnosis Date   Diverticulosis of colon    GERD (gastroesophageal reflux disease)    on meds- and with certain foods   History of cervical dysplasia 2003   CIN I and II  s/p LEEP   History of chlamydia 2000   treated   HSV-2 infection    Hypertension    on meds   Menorrhagia    Uterine fibroid    Wears partial dentures    upper    Past Surgical History:  Procedure Laterality Date   CESAREAN SECTION  2004   COLONOSCOPY WITH ESOPHAGOGASTRODUODENOSCOPY (EGD)  2017  dr Marina Goodell   DILATATION & CURETTAGE/HYSTEROSCOPY WITH TRUECLEAR N/A 09/07/2014   Procedure: DILATATION & CURETTAGE/HYSTEROSCOPY WITH TRUCLEAR;  Surgeon: Jaymes Graff, MD;  Location: [facility];  Service: Gynecology;  Laterality: N/A;   DILITATION & CURRETTAGE/HYSTROSCOPY WITH NOVASURE ABLATION N/A 01/09/2021   Procedure: HYSTEROSCOPY WITH HYDROTHERMAL ABLATION;  Surgeon: Jaymes Graff, MD;  Location: [facility];  Service: Gynecology;  Laterality: N/A;   LAPAROSCOPIC BILATERAL SALPINGECTOMY Bilateral 01/09/2021   Procedure: LAPAROSCOPIC BILATERAL SALPINGECTOMY;  Surgeon: Jaymes Graff, MD;  Location: [facility];  Service: Gynecology;  Laterality: Bilateral;   LAPAROSCOPIC CHOLECYSTECTOMY  07/30/15   LEEP  2003   MYRINGOTOMY WITH TUBE PLACEMENT Left 08/23/2023   Procedure: LEFT MYRINGOTOMY WITH TUBE PLACEMENT;  Surgeon: Serena Colonel, MD;  Location: [facility];  Service: ENT;  Laterality: Left;    Family History  Problem Relation Age of  Onset   Colon polyps Mother    Depression Mother    Cancer Father 73       Colon   Arthritis Father    Hypertension Father    Colon polyps Father 4   Colon cancer Father 76   Arthritis Sister    Depression Brother    Drug abuse Brother    Hypertension Brother    Colon polyps Paternal Grandmother    Colon cancer Paternal Grandmother        dx when young  and died   Esophageal cancer Neg Hx    Stomach cancer Neg Hx    Rectal cancer Neg Hx     Social History:  reports that she quit smoking about 22 years ago. Her smoking use included cigarettes. She started smoking about 26 years ago. She has never used smokeless tobacco. She reports current alcohol use of about 1.0 standard drink of alcohol per week. She reports that she does not use drugs.  Allergies: No Known Allergies  Medications: I have reviewed the patient's current medications.  The PMH, PSH, Medications, Allergies, and SH were reviewed and updated.  ROS: Constitutional: Negative for fever, weight loss and weight gain. Cardiovascular: Negative for chest pain and dyspnea on exertion. Respiratory: Is not experiencing shortness of breath at rest. Gastrointestinal: Negative for nausea and vomiting. Neurological: Negative for headaches. Psychiatric: The patient is not nervous/anxious  Blood pressure (!) 157/89, pulse 77, height 5\' 8"  (1.727 m), weight 214 lb (97.1 kg), last menstrual period 11/15/2023, SpO2 99%. Body mass index is 32.54 kg/m.  PHYSICAL EXAM:  Exam: General: Well-developed, well-nourished Respiratory Respiratory effort: Equal inspiration and expiration without stridor Cardiovascular Peripheral Vascular: Warm extremities with equal color/perfusion Eyes: No nystagmus with equal extraocular motion bilaterally Neuro/Psych/Balance: Patient oriented to person, place, and time; Appropriate mood and affect; Gait is intact with no imbalance; Cranial nerves I-XII are intact Head and Face Inspection: Normocephalic and atraumatic without mass or lesion Palpation: Facial skeleton intact without bony stepoffs Salivary Glands: No mass or tenderness Facial Strength: Facial motility symmetric and full bilaterally ENT Pinna: External ear intact and fully developed External canal: Canal is patent with intact skin Tympanic Membrane: Clear and mobile, L TM with PET in place,  patent, in good position External Nose: No scar or anatomic deformity Internal Nose: Septum is deviated to the left with significant narrowing of the left nasal passage. A large polyp in the left middle meatus, but no purulence. Mucosal edema and erythema present.  Bilateral inferior turbinate hypertrophy.  Lips, Teeth, and gums: Mucosa and teeth intact and viable TMJ: No pain to palpation with full mobility Oral cavity/oropharynx: No erythema or exudate, no lesions present Nasopharynx: No mass or lesion with intact mucosa, scant clear secretions Neck Neck and Trachea: Midline trachea without mass or lesion Thyroid: No mass or nodularity Lymphatics: No lymphadenopathy  Procedure:   PROCEDURE NOTE: nasal endoscopy  Preoperative diagnosis: chronic sinusitis symptoms  Postoperative diagnosis: same  Procedure: Diagnostic nasal endoscopy (16109)  Surgeon: Ashok Croon, M.D.  Anesthesia: Topical lidocaine and Afrin  H&P REVIEW: The patient's history and physical were reviewed today prior to procedure. All medications were reviewed and updated as well. Complications: None Condition is stable throughout exam Indications and consent: The patient presents with symptoms of chronic sinusitis not responding to previous therapies. All the risks, benefits, and potential complications were reviewed with the patient preoperatively and informed consent was obtained. The time out was completed with confirmation of the correct procedure.  Procedure: The patient was seated upright in the clinic. Topical lidocaine and Afrin were applied to the nasal cavity. After adequate anesthesia had occurred, the rigid nasal endoscope was passed into the nasal cavity. The nasal mucosa, turbinates, septum, and sinus drainage pathways were visualized bilaterally. This revealed no purulence or significant secretions that might be cultured. There was a polyp at the left middle meatus. The mucosa was intact and there  was no crusting present. The scope was then slowly withdrawn and the patient tolerated the procedure well. There were no complications or blood loss.      Studies Reviewed: 11/23/23 CT sinuses FINDINGS: Paranasal sinuses:   Frontal: Normally aerated. Patent frontal sinus drainage pathways.   Ethmoid: Partial opacification of the left ethmoid air cells.   Maxillary: Mucosal thickening and retained secretions within the left maxillary sinus. Normal right maxillary sinus.   Sphenoid: Normally aerated. Patent sphenoethmoidal recesses.   Right ostiomeatal unit: Patent.   Left ostiomeatal unit: Patent.   Nasal passages: Patent. Mild leftward septal deviation   Anatomy: No pneumatization superior to anterior ethmoid notches. Symmetric and intact olfactory grooves and fovea ethmoidalis, Keros III (8-45mm). Conchal sphenoid pneumatization pattern. No dehiscence of carotid or optic canals. No onodi cell.   Other: Orbits and intracranial compartment are unremarkable. Visible mastoid air cells are normally aerated.   IMPRESSION: 1. Mucosal thickening and retained secretions within the left maxillary sinus and partial opacification of the left ethmoid air cells. 2. Patent sinus drainage pathways. 3. Mild leftward septal deviation.     Assessment/Plan: Encounter Diagnoses  Name Primary?   Environmental allergies    Nasal polyp    Nasal septal deviation    Chronic nasal congestion Yes   Hypertrophy of both inferior nasal turbinates    Post-nasal drip    Nonintractable headache, unspecified chronicity pattern, unspecified headache type     Assessment and Plan Assessment & Plan Nasal polyp on nasal endoscopy Left nasal polyp near sinus drainage area (left middle meatus) may explain isolated left maxillary sinusitis on CT max/face she had done recently. The rest of paranasal sinuses were clear on the scan. Not large enough for surgery, and no other polyps noted on nasal  endoscopy. Allergy testing recommended. - Prescribe steroid nasal rinses from a compounding pharmacy. - Refer to an allergist for allergy testing and potential allergy shots. - Continue Zyrtec 10 mg daily. Rx sent - Consider repeat sinus scan if symptoms do not improve to determine if left maxillary sinus will resolve or persist - Educate on proper nasal spray technique to avoid epistaxis. Aim laterally  Chronic nasal congestion and Septal Deviation to the left ITH Chronic congestion cause persistent symptoms despite Zyrtec and nasal sprays. Allergy testing needed for potential allergy shots. - Refer to an allergist for allergy testing and potential allergy shots. - Continue Zyrtec 10 mg daily. - Initiate steroid nasal rinses. Rx sent to compounding pharmacy.  Eustachian tube dysfunction Suspected due to ear pressure issues and fluid sensation. Linked to chronic congestion and allergies. Ear tubes alleviate pressure symptoms. She had a left sided PET placed, and it helps her sx. PET patent on exam today.  - Continue allergy management with Zyrtec and nasal rinses.  Headaches - top and back of her head. We discussed that these are not likely related to her sinuses as she did not have CRS on her CT max/face - Consult primary care or neurology if headaches persist.  Aerosinusitis Headaches correlate with atmospheric pressure changes (flying), indicative of  air sinusitis. Manage with allergy medications and symptomatic treatment. - Continue allergy medications and nasal rinses. - Consider Sudafed or Mucinex for severe symptoms. - Consult primary care or neurology if headaches persist.  RTC 6 mo after allergy testing/consult Will consider repeat CT max/face if sx persist, will consider nasal polypectomy if sx persist.   Thank you for allowing me to participate in the care of this patient. Please do not hesitate to contact me with any questions or concerns.   Ashok Croon,  MD Otolaryngology [facility] Phone: [phone] Fax: [phone]    11/29/2023, 9:13 AM

## 2023-12-17 ENCOUNTER — Encounter: Payer: Self-pay | Admitting: Family Medicine

## 2023-12-20 ENCOUNTER — Encounter: Payer: Self-pay | Admitting: Family Medicine

## 2023-12-24 ENCOUNTER — Ambulatory Visit: Admitting: Family Medicine

## 2023-12-27 ENCOUNTER — Ambulatory Visit: Admitting: Family Medicine

## 2023-12-27 VITALS — BP 126/72 | HR 78 | Ht 68.0 in | Wt 227.0 lb

## 2023-12-27 DIAGNOSIS — L309 Dermatitis, unspecified: Secondary | ICD-10-CM | POA: Diagnosis not present

## 2023-12-27 MED ORDER — MOMETASONE FUROATE 0.1 % EX CREA: TOPICAL_CREAM | Freq: Every day | CUTANEOUS | 1 refills | Status: AC

## 2023-12-27 NOTE — Progress Notes (Signed)
 Subjective:    Patient ID: Ashley Wagner, female    DOB: Jan 10, 1976, 48 y.o.   MRN: [id number]  Patient presents today with an itchy placed on her left forearm for weeks.  Also reports right forearm.  Is a 1 cm hyperpigmented macule with fine white scale.  She states that it is itchy.  There is no erythema.  There is no warmth.  There is no fluctuance.  I suspect that this is likely residual from an insect bite. Past Medical History:  Diagnosis Date   Diverticulosis of colon    GERD (gastroesophageal reflux disease)    on meds- and with certain foods   History of cervical dysplasia 2003   CIN I and II  s/p LEEP   History of chlamydia 2000   treated   HSV-2 infection    Hypertension    on meds   Menorrhagia    Uterine fibroid    Wears partial dentures    upper   Past Surgical History:  Procedure Laterality Date   CESAREAN SECTION  2004   COLONOSCOPY WITH ESOPHAGOGASTRODUODENOSCOPY (EGD)  2017  dr Elvin Hammer   DILATATION & CURETTAGE/HYSTEROSCOPY WITH TRUECLEAR N/A 09/07/2014   Procedure: DILATATION & CURETTAGE/HYSTEROSCOPY WITH TRUCLEAR;  Surgeon: Marylu Soda, MD;  Location: [facility];  Service: Gynecology;  Laterality: N/A;   DILITATION & CURRETTAGE/HYSTROSCOPY WITH NOVASURE ABLATION N/A 01/09/2021   Procedure: HYSTEROSCOPY WITH HYDROTHERMAL ABLATION;  Surgeon: Marylu Soda, MD;  Location: [facility];  Service: Gynecology;  Laterality: N/A;   LAPAROSCOPIC BILATERAL SALPINGECTOMY Bilateral 01/09/2021   Procedure: LAPAROSCOPIC BILATERAL SALPINGECTOMY;  Surgeon: Marylu Soda, MD;  Location: [facility];  Service: Gynecology;  Laterality: Bilateral;   LAPAROSCOPIC CHOLECYSTECTOMY  07/30/15   LEEP  2003   MYRINGOTOMY WITH TUBE PLACEMENT Left 08/23/2023   Procedure: LEFT MYRINGOTOMY WITH TUBE PLACEMENT;  Surgeon: Janita Mellow, MD;  Location: [facility];  Service: ENT;  Laterality: Left;   Current Outpatient Medications on File Prior to Visit   Medication Sig Dispense Refill   amoxicillin -clavulanate (AUGMENTIN ) 875-125 MG tablet Take 1 tablet by mouth 2 (two) times daily. (Patient not taking: Reported on 11/29/2023) 20 tablet 0   cetirizine  (ZYRTEC ) 10 MG tablet Take 1 tablet (10 mg total) by mouth daily. 30 tablet 11   cyclobenzaprine  (FLEXERIL ) 10 MG tablet Take 1 tablet (10 mg total) by mouth 3 (three) times daily as needed for muscle spasms. 30 tablet 0   escitalopram  (LEXAPRO ) 10 MG tablet Take 1 tablet (10 mg total) by mouth daily. 30 tablet 5   ibuprofen  (ADVIL ) 200 MG tablet Take 200-600 mg by mouth as needed.     predniSONE  (DELTASONE ) 20 MG tablet 3 tabs poqday 1-2, 2 tabs poqday 3-4, 1 tab poqday 5-6 (Patient not taking: Reported on 11/29/2023) 12 tablet 0   triamcinolone  (NASACORT ) 55 MCG/ACT AERO nasal inhaler Place 2 sprays into the nose daily. 1 each 12   valACYclovir (VALTREX) 500 MG tablet Take 500 mg by mouth daily as needed (outbreaks).      valsartan  (DIOVAN ) 160 MG tablet Take 1 tablet (160 mg total) by mouth daily. 90 tablet 3   No current facility-administered medications on file prior to visit.   No Known Allergies Social History   Socioeconomic History   Marital status: Married    Spouse name: Not on file   Number of children: 1   Years of education: Not on file   Highest education level: Not on file  Occupational History   Occupation: Retail banker  Tobacco Use   Smoking status: Former    Current packs/day: 0.00    Types: Cigarettes    Start date: 08/27/1997    Quit date: 08/27/2001    Years since quitting: 22.3   Smokeless tobacco: Never  Vaping Use   Vaping status: Never Used  Substance and Sexual Activity   Alcohol use: Yes    Alcohol/week: 1.0 standard drink of alcohol    Types: 1 Standard drinks or equivalent per week    Comment: occasional   Drug use: Never   Sexual activity: Yes    Partners: Male    Birth control/protection: Pill  Other Topics Concern   Not on file   Social History Narrative   Not on file   Social Drivers of Health   Financial Resource Strain: Not on file  Food Insecurity: Low Risk  (06/08/2023)   Received from [facility]   Hunger Vital Sign    Worried About Running Out of Food in the Last Year: Never true    Ran Out of Food in the Last Year: Never true  Transportation Needs: No Transportation Needs (06/08/2023)   Received from [facility]    In the past 12 months, has lack of reliable transportation kept you from medical appointments, meetings, work or from getting things needed for daily living? : No  Physical Activity: Not on file  Stress: Not on file  Social Connections: Not on file  Intimate Partner Violence: Not on file      Review of Systems  All other systems reviewed and are negative.      Objective:   Physical Exam Vitals reviewed.  Constitutional:      General: She is not in acute distress.    Appearance: She is well-developed. She is not diaphoretic.  HENT:     Head: Normocephalic and atraumatic.     Nose: Nose normal.     Mouth/Throat:     Pharynx: No oropharyngeal exudate.  Eyes:     General: No scleral icterus.       Right eye: No discharge.        Left eye: No discharge.     Conjunctiva/sclera: Conjunctivae normal.     Pupils: Pupils are equal, round, and reactive to light.  Neck:     Thyroid: No thyromegaly.     Vascular: No JVD.     Trachea: No tracheal deviation.  Cardiovascular:     Rate and Rhythm: Normal rate and regular rhythm.     Heart sounds: Normal heart sounds. No murmur heard.    No friction rub.  Pulmonary:     Effort: Pulmonary effort is normal. No respiratory distress.     Breath sounds: Normal breath sounds. No stridor. No wheezing or rales.  Chest:     Chest wall: No tenderness.  Musculoskeletal:     Cervical back: Normal range of motion and neck supple.  Lymphadenopathy:     Cervical: No cervical adenopathy.  Skin:    General: Skin is warm.      Coloration: Skin is not pale.     Findings: No erythema or rash.  Neurological:     Mental Status: She is alert and oriented to person, place, and time.           Assessment & Plan:  Dermatitis I believe that this is most likely an insect bite.  Less likely would be ringworm.  It is very  approximately 1 cm.  Recommended trying Elocon cream once or twice daily for 1 week and see if this improves.

## 2024-02-02 ENCOUNTER — Other Ambulatory Visit: Payer: Self-pay | Admitting: Family Medicine

## 2024-02-11 ENCOUNTER — Encounter: Payer: Self-pay | Admitting: Family Medicine

## 2024-02-11 ENCOUNTER — Other Ambulatory Visit: Payer: Self-pay | Admitting: Family Medicine

## 2024-02-11 MED ORDER — AZITHROMYCIN 250 MG PO TABS: ORAL_TABLET | ORAL | 0 refills | Status: AC

## 2024-02-14 ENCOUNTER — Encounter: Payer: Self-pay | Admitting: Family Medicine

## 2024-02-14 ENCOUNTER — Ambulatory Visit: Admitting: Family Medicine

## 2024-02-14 VITALS — BP 120/78 | HR 68 | Temp 97.6°F | Ht 68.0 in | Wt 224.4 lb

## 2024-02-14 DIAGNOSIS — J40 Bronchitis, not specified as acute or chronic: Secondary | ICD-10-CM | POA: Diagnosis not present

## 2024-02-14 NOTE — Progress Notes (Signed)
 Subjective:    Patient ID: Ashley Wagner, female    DOB: May 29, 1976, 48 y.o.   MRN: [id number]  Patient became ill on Monday last week.  Symptoms included a cough productive of gray sputum, severe chest congestion, pleurisy.  Patient states that she was having coughing spasms to the point that she would wheeze and gasp for air.  She denies any fever.  She started a Z-Pak last Friday.  Symptoms have improved over the weekend.  She denies any chest pain.  Pulse oximetry is 99%.  She denies any pleurisy today.  She denies any shortness of breath.  She still coughs occasionally. Past Medical History:  Diagnosis Date   Diverticulosis of colon    GERD (gastroesophageal reflux disease)    on meds- and with certain foods   History of cervical dysplasia 2003   CIN I and II  s/p LEEP   History of chlamydia 2000   treated   HSV-2 infection    Hypertension    on meds   Menorrhagia    Uterine fibroid    Wears partial dentures    upper   Past Surgical History:  Procedure Laterality Date   CESAREAN SECTION  2004   COLONOSCOPY WITH ESOPHAGOGASTRODUODENOSCOPY (EGD)  2017  dr abran   DILATATION & CURETTAGE/HYSTEROSCOPY WITH TRUECLEAR N/A 09/07/2014   Procedure: DILATATION & CURETTAGE/HYSTEROSCOPY WITH TRUCLEAR;  Surgeon: Ovid All, MD;  Location: [facility];  Service: Gynecology;  Laterality: N/A;   DILITATION & CURRETTAGE/HYSTROSCOPY WITH NOVASURE ABLATION N/A 01/09/2021   Procedure: HYSTEROSCOPY WITH HYDROTHERMAL ABLATION;  Surgeon: All Ovid, MD;  Location: [facility];  Service: Gynecology;  Laterality: N/A;   LAPAROSCOPIC BILATERAL SALPINGECTOMY Bilateral 01/09/2021   Procedure: LAPAROSCOPIC BILATERAL SALPINGECTOMY;  Surgeon: All Ovid, MD;  Location: [facility];  Service: Gynecology;  Laterality: Bilateral;   LAPAROSCOPIC CHOLECYSTECTOMY  07/30/15   LEEP  2003   MYRINGOTOMY WITH TUBE PLACEMENT Left 08/23/2023   Procedure: LEFT MYRINGOTOMY WITH TUBE  PLACEMENT;  Surgeon: Jesus Oliphant, MD;  Location: [facility];  Service: ENT;  Laterality: Left;   Current Outpatient Medications on File Prior to Visit  Medication Sig Dispense Refill   amoxicillin -clavulanate (AUGMENTIN ) 875-125 MG tablet Take 1 tablet by mouth 2 (two) times daily. (Patient not taking: Reported on 12/27/2023) 20 tablet 0   azithromycin  (ZITHROMAX ) 250 MG tablet 2 tabs poqday1, 1 tab poqday 2-5 6 tablet 0   cetirizine  (ZYRTEC ) 10 MG tablet Take 1 tablet (10 mg total) by mouth daily. 30 tablet 11   cyclobenzaprine  (FLEXERIL ) 10 MG tablet Take 1 tablet (10 mg total) by mouth 3 (three) times daily as needed for muscle spasms. 30 tablet 0   escitalopram  (LEXAPRO ) 10 MG tablet TAKE 1 TABLET BY MOUTH EVERY DAY 90 tablet 1   ibuprofen  (ADVIL ) 200 MG tablet Take 200-600 mg by mouth as needed.     mometasone  (ELOCON ) 0.1 % cream Apply topically daily. 15 g 1   predniSONE  (DELTASONE ) 20 MG tablet 3 tabs poqday 1-2, 2 tabs poqday 3-4, 1 tab poqday 5-6 12 tablet 0   triamcinolone  (NASACORT ) 55 MCG/ACT AERO nasal inhaler Place 2 sprays into the nose daily. 1 each 12   valACYclovir (VALTREX) 500 MG tablet Take 500 mg by mouth daily as needed (outbreaks).      valsartan  (DIOVAN ) 160 MG tablet Take 1 tablet (160 mg total) by mouth daily. 90 tablet 3   No current facility-administered medications on file prior to visit.  No Known Allergies Social History   Socioeconomic History   Marital status: Married    Spouse name: Not on file   Number of children: 1   Years of education: Not on file   Highest education level: Not on file  Occupational History   Occupation: Retail banker  Tobacco Use   Smoking status: Former    Current packs/day: 0.00    Types: Cigarettes    Start date: 08/27/1997    Quit date: 08/27/2001    Years since quitting: 22.4   Smokeless tobacco: Never  Vaping Use   Vaping status: Never Used  Substance and Sexual Activity   Alcohol use: Yes     Alcohol/week: 1.0 standard drink of alcohol    Types: 1 Standard drinks or equivalent per week    Comment: occasional   Drug use: Never   Sexual activity: Yes    Partners: Male    Birth control/protection: Pill  Other Topics Concern   Not on file  Social History Narrative   Not on file   Social Drivers of Health   Financial Resource Strain: Not on file  Food Insecurity: Low Risk  (06/08/2023)   Received from [facility]   Hunger Vital Sign    Within the past 12 months, you worried that your food would run out before you got money to buy more: Never true    Within the past 12 months, the food you bought just didn't last and you didn't have money to get more. : Never true  Transportation Needs: No Transportation Needs (06/08/2023)   Received from [facility]    In the past 12 months, has lack of reliable transportation kept you from medical appointments, meetings, work or from getting things needed for daily living? : No  Physical Activity: Not on file  Stress: Not on file  Social Connections: Not on file  Intimate Partner Violence: Not on file      Review of Systems  All other systems reviewed and are negative.      Objective:   Physical Exam Vitals reviewed.  Constitutional:      General: She is not in acute distress.    Appearance: She is well-developed. She is not diaphoretic.  HENT:     Head: Normocephalic and atraumatic.     Nose: Rhinorrhea present.     Mouth/Throat:     Pharynx: No oropharyngeal exudate.   Eyes:     General: No scleral icterus.       Right eye: No discharge.        Left eye: No discharge.     Conjunctiva/sclera: Conjunctivae normal.     Pupils: Pupils are equal, round, and reactive to light.   Neck:     Thyroid: No thyromegaly.     Vascular: No JVD.     Trachea: No tracheal deviation.   Cardiovascular:     Rate and Rhythm: Normal rate and regular rhythm.     Heart sounds: Normal heart sounds. No murmur  heard.    No friction rub.  Pulmonary:     Effort: Pulmonary effort is normal. No respiratory distress.     Breath sounds: Normal breath sounds. No stridor. No wheezing, rhonchi or rales.  Chest:     Chest wall: No tenderness.   Musculoskeletal:     Cervical back: Normal range of motion and neck supple.  Lymphadenopathy:     Cervical: No cervical adenopathy.   Skin:    General:  Skin is warm.     Coloration: Skin is not pale.     Findings: No erythema or rash.   Neurological:     Mental Status: She is alert and oriented to person, place, and time.           Assessment & Plan:  Bronchitis Patient had bronchitis.  She responded to a Z-Pak.  Recommend tincture of time.  Complete the Z-Pak and use Mucinex as needed for cough.  She continues to have constant rhinorrhea from her left nose.  Of note, the patient has chronic left maxillary sinusitis confirmed on CT scan.  She is requesting to be able to work at home remotely.  She states that her boss at work is comfortable with this however they need a doctor's note to justify.  Patient states that she can do her job from home remotely on a computer.  She does have constant rhinorrhea.  Literally her left nostril is dripping like a faucet while we are talking today.  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  She has an appointment to see her ENT this fall to discuss surgery to repair the chronic sinusitis.  I am comfortable writing the patient a note requesting that she work remotely as long as her employer is okay with this.

## 2024-03-30 ENCOUNTER — Encounter: Payer: Self-pay | Admitting: Family Medicine

## 2024-04-04 ENCOUNTER — Ambulatory Visit: Admitting: Internal Medicine

## 2024-04-04 ENCOUNTER — Other Ambulatory Visit: Payer: Self-pay

## 2024-04-04 ENCOUNTER — Encounter: Payer: Self-pay | Admitting: Internal Medicine

## 2024-04-04 VITALS — BP 124/72 | HR 62 | Temp 98.7°F | Ht 67.0 in | Wt 223.6 lb

## 2024-04-04 DIAGNOSIS — J329 Chronic sinusitis, unspecified: Secondary | ICD-10-CM | POA: Diagnosis not present

## 2024-04-04 DIAGNOSIS — J3089 Other allergic rhinitis: Secondary | ICD-10-CM | POA: Diagnosis not present

## 2024-04-04 DIAGNOSIS — J343 Hypertrophy of nasal turbinates: Secondary | ICD-10-CM | POA: Diagnosis not present

## 2024-04-04 NOTE — Patient Instructions (Addendum)
 Other Allergic Rhinitis: - Use nasal saline rinses before nose sprays such as with Neilmed Sinus Rinse.  Use distilled water.   - Use Nasacort  2 sprays each nostril daily. Aim upward and outward. - Use Zyrtec  10 mg daily.   Recurrent Sinus Infection - Will do an immune evaluation with Ig and vaccine titers.   Hold all anti-histamines (Xyzal , Allegra, Zyrtec , Claritin, Benadryl, Pepcid) 3 days prior to next visit.  Follow up: 9 AM on 8/19 for skin testing 1-55

## 2024-04-04 NOTE — Progress Notes (Signed)
 NEW PATIENT  Date of Service/Encounter:  04/04/24  Consult requested by: Duanne Butler DASEN, MD   Subjective:   Ashley Wagner (DOB: 07-May-1976) is a 48 y.o. female who presents to the clinic on 04/04/2024 with a chief complaint of Allergy, Eczema, and Establish Care .    History obtained from: chart review and patient.   Rhinitis:  Sinus Infections/Sinusitis:  Started about 3 years ago.   Symptoms include: nasal congestion, rhinorrhea, post nasal drainage, and itchy eyes , sinus pressure, received antibiotics for sinus infection about 2-3 times a year.   No abscess, GI infections, deep organ infections.  Never required IV abx.    Occurs year-round with seasonal flares in Spring  Potential triggers: not sure  Treatments tried:  Saline + budesonide rinses twice daily for 30 days Zyrtec  Nasacort    Previous allergy testing: no History of sinus surgery: no Nonallergic triggers: none     Reviewed:  02/14/2024: seen by Dr Duanne for bronchitis, discussed use of z pack and mucinex for cough.  Also with chronic L maxillary sinusitis.  Planning to follow up with ENT.     11/29/2023: seen by [facility] for chronic congestion and drainage. Referred to Allergist for allergic component, Zyrtec  daily, plan for repeat sinus CT.  No polyps on CT or scope. Also with ET dysfunction.  Use Zyrtec  and nasal steroid rinses.   11/26/2023: CT face IMPRESSION: 1. Mucosal thickening and retained secretions within the left maxillary sinus and partial opacification of the left ethmoid air cells. 2. Patent sinus drainage pathways. 3. Mild leftward septal deviation.   Past Medical History: Past Medical History:  Diagnosis Date   Diverticulosis of colon    GERD (gastroesophageal reflux disease)    on meds- and with certain foods   History of cervical dysplasia 2003   CIN I and II  s/p LEEP   History of chlamydia 2000   treated   HSV-2 infection    Hypertension    on meds   Menorrhagia     Uterine fibroid    Wears partial dentures    upper    Past Surgical History: Past Surgical History:  Procedure Laterality Date   CESAREAN SECTION  2004   COLONOSCOPY WITH ESOPHAGOGASTRODUODENOSCOPY (EGD)  2017  dr abran   DILATATION & CURETTAGE/HYSTEROSCOPY WITH TRUECLEAR N/A 09/07/2014   Procedure: DILATATION & CURETTAGE/HYSTEROSCOPY WITH TRUCLEAR;  Surgeon: Ovid All, MD;  Location: [facility];  Service: Gynecology;  Laterality: N/A;   DILITATION & CURRETTAGE/HYSTROSCOPY WITH NOVASURE ABLATION N/A 01/09/2021   Procedure: HYSTEROSCOPY WITH HYDROTHERMAL ABLATION;  Surgeon: All Ovid, MD;  Location: [facility];  Service: Gynecology;  Laterality: N/A;   LAPAROSCOPIC BILATERAL SALPINGECTOMY Bilateral 01/09/2021   Procedure: LAPAROSCOPIC BILATERAL SALPINGECTOMY;  Surgeon: All Ovid, MD;  Location: [facility];  Service: Gynecology;  Laterality: Bilateral;   LAPAROSCOPIC CHOLECYSTECTOMY  07/30/15   LEEP  2003   MYRINGOTOMY WITH TUBE PLACEMENT Left 08/23/2023   Procedure: LEFT MYRINGOTOMY WITH TUBE PLACEMENT;  Surgeon: Jesus Oliphant, MD;  Location: [facility];  Service: ENT;  Laterality: Left;    Family History: Family History  Problem Relation Age of Onset   Colon polyps Mother    Depression Mother    Cancer Father 8       Colon   Arthritis Father    Hypertension Father    Colon polyps Father 66   Colon cancer Father 9   Urticaria Sister    Eczema Sister  Arthritis Sister    Depression Brother    Drug abuse Brother    Hypertension Brother    Colon polyps Paternal Grandmother    Colon cancer Paternal Grandmother        dx when young and died   Esophageal cancer Neg Hx    Stomach cancer Neg Hx    Rectal cancer Neg Hx     Social History:  Flooring in bedroom: Engineer, civil (consulting) Pets: dog  Tobacco use/exposure: quit about 20 years ago; smoked for 5 years, 1/2ppd  Job: [employer]   Medication List:  Allergies as of 04/04/2024    No Known Allergies      Medication List        Accurate as of April 04, 2024  3:01 PM. If you have any questions, ask your nurse or doctor.          amoxicillin -clavulanate 875-125 MG tablet Commonly known as: AUGMENTIN  Take 1 tablet by mouth 2 (two) times daily.   azithromycin  250 MG tablet Commonly known as: ZITHROMAX  2 tabs poqday1, 1 tab poqday 2-5   cetirizine  10 MG tablet Commonly known as: ZYRTEC  Take 1 tablet (10 mg total) by mouth daily.   cyclobenzaprine  10 MG tablet Commonly known as: FLEXERIL  Take 1 tablet (10 mg total) by mouth 3 (three) times daily as needed for muscle spasms.   escitalopram  10 MG tablet Commonly known as: LEXAPRO  TAKE 1 TABLET BY MOUTH EVERY DAY   ibuprofen  200 MG tablet Commonly known as: ADVIL  Take 200-600 mg by mouth as needed.   mometasone  0.1 % cream Commonly known as: ELOCON  Apply topically daily. What changed:  when to take this reasons to take this   predniSONE  20 MG tablet Commonly known as: DELTASONE  3 tabs poqday 1-2, 2 tabs poqday 3-4, 1 tab poqday 5-6   triamcinolone  55 MCG/ACT Aero nasal inhaler Commonly known as: NASACORT  Place 2 sprays into the nose daily.   valACYclovir 500 MG tablet Commonly known as: VALTREX Take 500 mg by mouth daily as needed (outbreaks).   valsartan  160 MG tablet Commonly known as: DIOVAN  Take 1 tablet (160 mg total) by mouth daily.         REVIEW OF SYSTEMS: Pertinent positives and negatives discussed in HPI.   Objective:   Physical Exam: BP 124/72 (BP Location: Left Arm, Patient Position: Sitting, Cuff Size: Large)   Pulse 62   Temp 98.7 F (37.1 C) (Temporal)   Ht 5' 7 (1.702 m)   Wt 223 lb 9.6 oz (101.4 kg)   SpO2 99%   BMI 35.02 kg/m  Body mass index is 35.02 kg/m. GEN: alert, well developed HEENT: clear conjunctiva, nose with + mild inferior turbinate hypertrophy, pale nasal mucosa, + clear rhinorrhea, + cobblestoning HEART: regular rate and rhythm, no  murmur LUNGS: clear to auscultation bilaterally, no coughing, unlabored respiration ABDOMEN: soft, non distended  SKIN: no rashes or lesions  Assessment:   1. Other allergic rhinitis   2. Recurrent sinus infections   3. Nasal turbinate hypertrophy     Plan/Recommendations:  Other Allergic Rhinitis: - Due to turbinate hypertrophy, seasonal flare ups, chronic sinusitis, recurrent sinus infections and unresponsive to over the counter meds, will perform skin testing to identify aeroallergen triggers.   - Use nasal saline rinses before nose sprays such as with Neilmed Sinus Rinse.  Use distilled water.   - Use Nasacort  2 sprays each nostril daily. Aim upward and outward. - Use Zyrtec  10 mg daily.  - Followed by  [facility].    Recurrent Sinus Infection - Will do an immune evaluation with Ig and vaccine titers.   Hold all anti-histamines (Xyzal, Allegra, Zyrtec , Claritin, Benadryl, Pepcid) 3 days prior to next visit.  Follow up: 9 AM on 8/19 for skin testing 1-55, IDs okay    Arleta Blanch, MD [facility] 

## 2024-04-11 ENCOUNTER — Encounter: Payer: Self-pay | Admitting: Internal Medicine

## 2024-04-11 ENCOUNTER — Ambulatory Visit: Admitting: Internal Medicine

## 2024-04-11 DIAGNOSIS — J3089 Other allergic rhinitis: Secondary | ICD-10-CM

## 2024-04-11 LAB — STREP PNEUMONIAE 23 SEROTYPES IGG
Pneumo Ab Type 1*: <0.1 ug/mL — AB (ref >1.3)
Pneumo Ab Type 12 (12F)*: 0.2 ug/mL — AB (ref >1.3)
Pneumo Ab Type 14*: <0.1 ug/mL — AB (ref >1.3)
Pneumo Ab Type 17 (17F)*: 0.7 ug/mL — AB (ref >1.3)
Pneumo Ab Type 19 (19F)*: 1.8 ug/mL (ref >1.3)
Pneumo Ab Type 2*: 0.2 ug/mL — AB (ref >1.3)
Pneumo Ab Type 20*: 0.6 ug/mL — AB (ref >1.3)
Pneumo Ab Type 22 (22F)*: <0.1 ug/mL — AB (ref >1.3)
Pneumo Ab Type 23 (23F)*: 0.3 ug/mL — AB (ref >1.3)
Pneumo Ab Type 26 (6B)*: <0.1 ug/mL — AB (ref >1.3)
Pneumo Ab Type 3*: <0.1 ug/mL — AB (ref >1.3)
Pneumo Ab Type 34 (10A)*: 0.1 ug/mL — AB (ref >1.3)
Pneumo Ab Type 4*: <0.1 ug/mL — AB (ref >1.3)
Pneumo Ab Type 43 (11A)*: 0.1 ug/mL — AB (ref >1.3)
Pneumo Ab Type 5*: <0.1 ug/mL — AB (ref >1.3)
Pneumo Ab Type 51 (7F)*: 0.3 ug/mL — AB (ref >1.3)
Pneumo Ab Type 54 (15B)*: <0.2 ug/mL — AB (ref >1.3)
Pneumo Ab Type 56 (18C)*: <0.1 ug/mL — AB (ref >1.3)
Pneumo Ab Type 57 (19A)*: 1.2 ug/mL — AB (ref >1.3)
Pneumo Ab Type 68 (9V)*: 0.2 ug/mL — AB (ref >1.3)
Pneumo Ab Type 70 (33F)*: 0.2 ug/mL — AB (ref >1.3)
Pneumo Ab Type 8*: <0.3 ug/mL — AB (ref >1.3)
Pneumo Ab Type 9 (9N)*: <0.1 ug/mL — AB (ref >1.3)

## 2024-04-11 LAB — IGG, IGA, IGM
IgA/Immunoglobulin A, Serum: 296 mg/dL (ref 87–352)
IgG (Immunoglobin G), Serum: 1521 mg/dL (ref 586–1602)
IgM (Immunoglobulin M), Srm: 129 mg/dL (ref 26–217)

## 2024-04-11 LAB — DIPHTHERIA / TETANUS ANTIBODY PANEL
Diphtheria Ab: 0.35 [IU]/mL (ref <0.10)
Tetanus Ab, IgG: 2.27 [IU]/mL (ref <0.10)

## 2024-04-11 MED ORDER — AZELASTINE HCL 0.1 % NA SOLN
2.0000 | Freq: Two times a day (BID) | NASAL | 5 refills | Status: DC
Start: 2024-04-11 — End: 2024-07-12

## 2024-04-11 NOTE — Progress Notes (Signed)
 FOLLOW UP Date of Service/Encounter:  04/11/24   Subjective:  Ashley Wagner (DOB: September 27, 1975) is a 48 y.o. female who [facility] for follow up for skin testing.   History obtained from: chart review and patient.  Anti histamines held.   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 There is no height or weight on file to calculate BMI. Physical Exam: GEN: alert, well developed HEENT: clear conjunctiva, MMM LUNGS: unlabored respiration  Skin Testing:  Skin prick testing was placed, which includes aeroallergens/foods, histamine control, and saline control.  Verbal consent was obtained prior to placing test.  Patient tolerated procedure well.  Allergy  testing results were read and interpreted by myself, documented by clinical staff. Adequate positive and negative control.  Positive results to:  Results discussed with patient/family.  Airborne Adult Perc - 04/11/24 1002     Time Antigen Placed 1002    Allergen Manufacturer Greer    Location Back    Number of Test 55    1. Control-Buffer 50% Glycerol Negative    2. Control-Histamine 3+    3. Bahia Negative    4. French Southern Territories Negative    5. Johnson Negative    6. [place]  Blue Negative    7. Meadow Fescue Negative    8. Perennial Rye Negative    9. Timothy Negative    10. Ragweed Mix Negative    11. Cocklebur Negative    12. Plantain,  English Negative    13. Baccharis Negative    14. Dog Fennel Negative    15. Russian Thistle Negative    16. Lamb's Quarters Negative    17. Sheep  Sorrell Negative    18. Rough Pigweed Negative    19. Marsh Elder, Rough Negative    20. Mugwort, Common Negative    21. Box, Elder Negative    22. Cedar, red Negative    23. Sweet Gum Negative    24. Pecan Pollen Negative    25. Pine Mix Negative    26. Walnut, Black Pollen Negative    27. Red Mulberry Negative    28. Ash Mix Negative    29. Birch Mix Negative    30. Beech American Negative    31. Cottonwood, Guinea-Bissau Negative    32. Hickory, White Negative    33. Maple Mix Negative    34. Oak, Guinea-[place]au Mix Negative    35. Sycamore Eastern Negative    36. Alternaria Alternata Negative    37. Cladosporium Herbarum Negative    38. Aspergillus Mix Negative    39. Penicillium Mix Negative    40. Bipolaris Sorokiniana (Helminthosporium) Negative    41. Drechslera Spicifera (Curvularia) Negative    42. Mucor Plumbeus Negative    43. Fusarium Moniliforme Negative    44. Aureobasidium Pullulans (pullulara) Negative    45. Rhizopus Oryzae Negative    46. Botrytis Cinera Negative    47. Epicoccum Nigrum Negative  48. Phoma Betae Negative    49. Dust Mite Mix Negative    50. Cat Hair 10,000 BAU/ml Negative    51.  Dog Epithelia Negative    52. Mixed Feathers Negative    53. Horse Epithelia Negative    54. Cockroach, German Negative    55. Tobacco Leaf Negative          Intradermal - 04/11/24 1045     Time Antigen Placed 1045    Allergen Manufacturer Greer    Location Back    Number of Test 16    Control Negative    Bahia Negative    French Southern Territories Negative    Johnson Negative    7 Grass Negative    Ragweed Mix Negative    Weed Mix Negative    Tree Mix Negative    Mold 1 Negative    Mold 2 Negative    Mold 3 Negative    Mold 4 Negative    Mite Mix Negative    Cat Negative    Dog Negative    Cockroach Negative           Assessment:   1. Other allergic rhinitis     Plan/Recommendations:  Other Allergic Rhinitis: - Due to turbinate hypertrophy, seasonal flare  ups, chronic sinusitis, recurrent sinus infections and unresponsive to over the counter meds, will perform skin testing to identify aeroallergen triggers.   - Positive skin test 03/2024: none - Use nasal saline rinses before nose sprays such as with Neilmed Sinus Rinse.  Use distilled water.   - Use Nasacort  2 sprays each nostril daily. Aim upward and outward. - Use Azelastine  2 sprays each nostril twice daily. Aim upward and outward. - Use Zyrtec  10 mg daily.  - Followed by [facility].    Recurrent Sinus Infection - Normal Ig, protein vaccine response, polysaccharide response pending      Return in about 3 months (around 07/12/2024).  Arleta Blanch, MD [facility]      e

## 2024-04-11 NOTE — Patient Instructions (Addendum)
 Chronic Rhinitis: - Positive skin test 03/2024: none - Use nasal saline rinses before nose sprays such as with Neilmed Sinus Rinse.  Use distilled water.   - Use Nasacort  2 sprays each nostril daily. Aim upward and outward. - Use Azelastine  2 sprays each nostril twice daily. Aim upward and outward. - Use Zyrtec  10 mg daily.  - Followed by [facility].    Recurrent Sinus Infection - Normal Ig, protein vaccine response, polysaccharide response pending

## 2024-04-12 ENCOUNTER — Other Ambulatory Visit: Payer: Self-pay | Admitting: *Deleted

## 2024-04-12 ENCOUNTER — Ambulatory Visit: Payer: Self-pay | Admitting: Internal Medicine

## 2024-04-12 DIAGNOSIS — J329 Chronic sinusitis, unspecified: Secondary | ICD-10-CM

## 2024-04-18 ENCOUNTER — Encounter: Payer: Self-pay | Admitting: Family Medicine

## 2024-04-18 ENCOUNTER — Encounter (INDEPENDENT_AMBULATORY_CARE_PROVIDER_SITE_OTHER): Payer: Self-pay | Admitting: Otolaryngology

## 2024-04-19 ENCOUNTER — Other Ambulatory Visit (INDEPENDENT_AMBULATORY_CARE_PROVIDER_SITE_OTHER): Payer: Self-pay | Admitting: Otolaryngology

## 2024-04-19 MED ORDER — IPRATROPIUM BROMIDE 0.03 % NA SOLN
2.0000 | Freq: Two times a day (BID) | NASAL | 12 refills | Status: DC
Start: 2024-04-19 — End: 2024-07-12

## 2024-04-19 MED ORDER — LEVOCETIRIZINE DIHYDROCHLORIDE 5 MG PO TABS: 5.0000 mg | ORAL_TABLET | Freq: Every evening | ORAL | 3 refills | Status: DC

## 2024-04-21 ENCOUNTER — Encounter: Payer: Self-pay | Admitting: Family Medicine

## 2024-04-21 ENCOUNTER — Ambulatory Visit (INDEPENDENT_AMBULATORY_CARE_PROVIDER_SITE_OTHER)

## 2024-04-21 ENCOUNTER — Other Ambulatory Visit: Payer: Self-pay

## 2024-04-21 DIAGNOSIS — J329 Chronic sinusitis, unspecified: Secondary | ICD-10-CM

## 2024-04-21 DIAGNOSIS — Z23 Encounter for immunization: Secondary | ICD-10-CM | POA: Diagnosis not present

## 2024-04-21 DIAGNOSIS — I10 Essential (primary) hypertension: Secondary | ICD-10-CM

## 2024-04-21 MED ORDER — VALSARTAN 160 MG PO TABS: 160.0000 mg | ORAL_TABLET | Freq: Every day | ORAL | 1 refills | Status: AC

## 2024-04-21 NOTE — Progress Notes (Signed)
 Lot #:[id number] EXP: 07/29/2025 Injection Site: R Deltoid  Consent signed and patient instructions given. Patient waited 15 minutes in office without any issues.

## 2024-04-25 ENCOUNTER — Other Ambulatory Visit (HOSPITAL_COMMUNITY): Payer: Self-pay

## 2024-04-25 NOTE — Telephone Encounter (Signed)
 Good morning Colleen, yes we can appeal the denial but we would need information from her personal trainer to proceed.  Thanks

## 2024-05-08 ENCOUNTER — Ambulatory Visit: Admitting: Family Medicine

## 2024-05-08 ENCOUNTER — Encounter: Payer: Self-pay | Admitting: Family Medicine

## 2024-05-08 VITALS — BP 138/78 | HR 77 | Temp 98.3°F | Ht 67.0 in | Wt 225.0 lb

## 2024-05-08 DIAGNOSIS — M5412 Radiculopathy, cervical region: Secondary | ICD-10-CM

## 2024-05-08 MED ORDER — PREDNISONE 20 MG PO TABS: ORAL_TABLET | ORAL | 0 refills | Status: AC

## 2024-05-08 NOTE — Progress Notes (Signed)
 Subjective:    Patient ID: Ashley Wagner, female    DOB: [birth year], 48 y.o.   MRN: [id number] For the last few weeks, the patient has had pain radiating down her left arm from her left shoulder through her left elbow into her left hand.  She reports numbness and tingling in all 5 fingers on her left hand.  She also reports an aching pain in her left upper chest left axilla area.  There is no exacerbating or alleviating factors.  The pain is not related to exertion.  It comes and goes.  However the 2 pains seem to be related and occur simultaneously.  She does have some pain lifting her arm up overhead.  She also has some mild pain turning her head to the left.  She also has venous stasis changes in the skin of both legs distal to the knee with what appears to be a healing venous stasis ulcer on her left lateral calf that is about the size of a nickel Past Medical History:  Diagnosis Date  . Diverticulosis of colon   . GERD (gastroesophageal reflux disease)    on meds- and with certain foods  . History of cervical dysplasia 2003   CIN I and II  s/p LEEP  . History of chlamydia 2000   treated  . HSV-2 infection   . Hypertension    on meds  . Menorrhagia   . Uterine fibroid   . Wears partial dentures    upper   Past Surgical History:  Procedure Laterality Date  . CESAREAN SECTION  2004  . COLONOSCOPY WITH ESOPHAGOGASTRODUODENOSCOPY (EGD)  2017  dr abran  . DILATATION & CURETTAGE/HYSTEROSCOPY WITH TRUECLEAR N/A 09/07/2014   Procedure: DILATATION & CURETTAGE/HYSTEROSCOPY WITH TRUCLEAR;  Surgeon: Ovid All, MD;  Location: [facility];  Service: Gynecology;  Laterality: N/A;  . DILITATION & CURRETTAGE/HYSTROSCOPY WITH NOVASURE ABLATION N/A 01/09/2021   Procedure: HYSTEROSCOPY WITH HYDROTHERMAL ABLATION;  Surgeon: All Ovid, MD;  Location: [facility];  Service: Gynecology;  Laterality: N/A;  . LAPAROSCOPIC BILATERAL SALPINGECTOMY Bilateral 01/09/2021   Procedure:  LAPAROSCOPIC BILATERAL SALPINGECTOMY;  Surgeon: All Ovid, MD;  Location: [facility];  Service: Gynecology;  Laterality: Bilateral;  . LAPAROSCOPIC CHOLECYSTECTOMY  07/30/15  . LEEP  2003  . MYRINGOTOMY WITH TUBE PLACEMENT Left 08/23/2023   Procedure: LEFT MYRINGOTOMY WITH TUBE PLACEMENT;  Surgeon: Jesus Oliphant, MD;  Location: [facility];  Service: ENT;  Laterality: Left;   Current Outpatient Medications on File Prior to Visit  Medication Sig Dispense Refill  . ipratropium (ATROVENT ) 0.03 % nasal spray Place 2 sprays into both nostrils every 12 (twelve) hours. 30 mL 12  . levocetirizine (XYZAL  ALLERGY  24HR) 5 MG tablet Take 1 tablet (5 mg total) by mouth every evening. 30 tablet 3  . amoxicillin -clavulanate (AUGMENTIN ) 875-125 MG tablet Take 1 tablet by mouth 2 (two) times daily. (Patient not taking: Reported on 05/08/2024) 20 tablet 0  . azelastine  (ASTELIN ) 0.1 % nasal spray Place 2 sprays into both nostrils 2 (two) times daily. Use in each nostril as directed 30 mL 5  . azithromycin  (ZITHROMAX ) 250 MG tablet 2 tabs poqday1, 1 tab poqday 2-5 (Patient not taking: Reported on 05/08/2024) 6 tablet 0  . cetirizine  (ZYRTEC ) 10 MG tablet Take 1 tablet (10 mg total) by mouth daily. 30 tablet 11  . cyclobenzaprine  (FLEXERIL ) 10 MG tablet Take 1 tablet (10 mg total) by mouth 3 (three) times daily as needed for muscle  spasms. 30 tablet 0  . escitalopram  (LEXAPRO ) 10 MG tablet TAKE 1 TABLET BY MOUTH EVERY DAY 90 tablet 1  . ibuprofen  (ADVIL ) 200 MG tablet Take 200-600 mg by mouth as needed.    . mometasone  (ELOCON ) 0.1 % cream Apply topically daily. (Patient taking differently: Apply topically as needed.) 15 g 1  . triamcinolone  (NASACORT ) 55 MCG/ACT AERO nasal inhaler Place 2 sprays into the nose daily. 1 each 12  . valACYclovir (VALTREX) 500 MG tablet Take 500 mg by mouth daily as needed (outbreaks).     . valsartan  (DIOVAN ) 160 MG tablet Take 1 tablet (160 mg total) by  mouth daily. 90 tablet 1   No current facility-administered medications on file prior to visit.   No Known Allergies Social History   Socioeconomic History  . Marital status: Married    Spouse name: Not on file  . Number of children: 1  . Years of education: Not on file  . Highest education level: Not on file  Occupational History  . Occupation: Retail banker  Tobacco Use  . Smoking status: Former    Current packs/day: 0.00    Types: Cigarettes    Start date: 08/27/1997    Quit date: 08/27/2001    Years since quitting: 22.7  . Smokeless tobacco: Never  Vaping Use  . Vaping status: Never Used  Substance and Sexual Activity  . Alcohol use: Yes    Alcohol/week: 1.0 standard drink of alcohol    Types: 1 Standard drinks or equivalent per week    Comment: occasional  . Drug use: Never  . Sexual activity: Yes    Partners: Male    Birth control/protection: Pill  Other Topics Concern  . Not on file  Social History Narrative  . Not on file   Social Drivers of Health   Financial Resource Strain: Not on file  Food Insecurity: Low Risk  (06/08/2023)   Received from [facility]   Hunger Vital Sign   . Within the past 12 months, you worried that your food would run out before you got money to buy more: Never true   . Within the past 12 months, the food you bought just didn't last and you didn't have money to get more. : Never true  Transportation Needs: No Transportation Needs (06/08/2023)   Received from [facility]   . In the past 12 months, has lack of reliable transportation kept you from medical appointments, meetings, work or from getting things needed for daily living? : No  Physical Activity: Not on file  Stress: Not on file  Social Connections: Not on file  Intimate Partner Violence: Not on file      Review of Systems  All other systems reviewed and are negative.      Objective:   Physical Exam Vitals reviewed.   Constitutional:      General: She is not in acute distress.    Appearance: She is well-developed. She is not diaphoretic.  HENT:     Head: Normocephalic and atraumatic.     Mouth/Throat:     Pharynx: No oropharyngeal exudate.  Eyes:     General: No scleral icterus.       Right eye: No discharge.        Left eye: No discharge.     Conjunctiva/sclera: Conjunctivae normal.     Pupils: Pupils are equal, round, and reactive to light.  Neck:     Thyroid: No thyromegaly.  Vascular: No JVD.     Trachea: No tracheal deviation.  Cardiovascular:     Rate and Rhythm: Normal rate and regular rhythm.     Heart sounds: Normal heart sounds. No murmur heard.    No friction rub.  Pulmonary:     Effort: Pulmonary effort is normal. No respiratory distress.     Breath sounds: Normal breath sounds. No stridor. No wheezing, rhonchi or rales.  Chest:     Chest wall: No tenderness.  Musculoskeletal:     Cervical back: Normal range of motion and neck supple.       Back:  Lymphadenopathy:     Cervical: No cervical adenopathy.  Skin:    General: Skin is warm.     Coloration: Skin is not pale.     Findings: Rash present. No erythema. Rash is macular.      Neurological:     Mental Status: She is alert and oriented to person, place, and time.           Assessment & Plan:  Cervical radiculopathy - Plan: DG Cervical Spine Complete I believe she has cervical radiculopathy .  Begin a prednisone  taper pack and obtain an x-ray of the cervical spine.  She also has a healing venous stasis ulcer on the left leg that is very small.  Recommended that she wear compression hose daily to help facilitate healing and resolution of this.  She also has evidence of Schamberg's disease and venous stasis dermatitis related to her chronic venous insufficiency.  I stressed the importance of wearing compression hose.

## 2024-05-15 ENCOUNTER — Ambulatory Visit
Admission: RE | Admit: 2024-05-15 | Discharge: 2024-05-15 | Disposition: A | Discharge status: Home / Self Care | Source: Ambulatory Visit | Attending: Family Medicine | Admitting: Family Medicine

## 2024-05-15 DIAGNOSIS — M5412 Radiculopathy, cervical region: Secondary | ICD-10-CM

## 2024-05-15 DIAGNOSIS — M50123 Cervical disc disorder at C6-C7 level with radiculopathy: Secondary | ICD-10-CM | POA: Diagnosis not present

## 2024-05-22 ENCOUNTER — Ambulatory Visit: Payer: Self-pay | Admitting: Family Medicine

## 2024-05-23 ENCOUNTER — Other Ambulatory Visit: Payer: Self-pay | Admitting: Family Medicine

## 2024-05-23 DIAGNOSIS — M5412 Radiculopathy, cervical region: Secondary | ICD-10-CM

## 2024-05-23 DIAGNOSIS — M5030 Other cervical disc degeneration, unspecified cervical region: Secondary | ICD-10-CM

## 2024-05-23 NOTE — Telephone Encounter (Signed)
 Copied from CRM [id number]. Topic: General - Other >> May 23, 2024 10:14 AM Larissa S wrote: Reason for CRM: Francina with [facility] states that patient is needing an authorization for MRI. Callback # [id number] ext [phone]

## 2024-05-24 ENCOUNTER — Encounter: Payer: Self-pay | Admitting: Family Medicine

## 2024-05-26 ENCOUNTER — Ambulatory Visit
Admission: RE | Admit: 2024-05-26 | Discharge: 2024-05-26 | Disposition: A | Discharge status: Home / Self Care | Source: Ambulatory Visit | Attending: Family Medicine | Admitting: Family Medicine

## 2024-05-26 DIAGNOSIS — M5412 Radiculopathy, cervical region: Secondary | ICD-10-CM

## 2024-05-26 DIAGNOSIS — M5030 Other cervical disc degeneration, unspecified cervical region: Secondary | ICD-10-CM

## 2024-06-01 ENCOUNTER — Ambulatory Visit (INDEPENDENT_AMBULATORY_CARE_PROVIDER_SITE_OTHER): Admitting: Otolaryngology

## 2024-06-01 ENCOUNTER — Encounter (INDEPENDENT_AMBULATORY_CARE_PROVIDER_SITE_OTHER): Payer: Self-pay | Admitting: Otolaryngology

## 2024-06-01 VITALS — BP 137/92 | HR 94 | Temp 98.5°F

## 2024-06-01 DIAGNOSIS — Z87891 Personal history of nicotine dependence: Secondary | ICD-10-CM | POA: Diagnosis not present

## 2024-06-01 DIAGNOSIS — J339 Nasal polyp, unspecified: Secondary | ICD-10-CM | POA: Diagnosis not present

## 2024-06-01 DIAGNOSIS — J320 Chronic maxillary sinusitis: Secondary | ICD-10-CM

## 2024-06-01 DIAGNOSIS — R0981 Nasal congestion: Secondary | ICD-10-CM | POA: Diagnosis not present

## 2024-06-01 DIAGNOSIS — J343 Hypertrophy of nasal turbinates: Secondary | ICD-10-CM

## 2024-06-01 DIAGNOSIS — Z9109 Other allergy status, other than to drugs and biological substances: Secondary | ICD-10-CM

## 2024-06-01 DIAGNOSIS — R0982 Postnasal drip: Secondary | ICD-10-CM | POA: Diagnosis not present

## 2024-06-01 DIAGNOSIS — J342 Deviated nasal septum: Secondary | ICD-10-CM

## 2024-06-01 MED ORDER — BUDESONIDE 0.5 MG/2ML IN SUSP: 0.5000 mg | Freq: Two times a day (BID) | RESPIRATORY_TRACT | 12 refills | Status: AC

## 2024-06-01 NOTE — Progress Notes (Signed)
 ENT Progress Note:   Update 06/01/2024  Discussed the use of AI scribe software for clinical note transcription with the patient, who gave verbal consent to proceed.  History of Present Illness Ashley Wagner is a 48 year old female who presents with persistent chronic nasal congestion and drainage.  She experiences persistent nasal congestion and drainage. Her nose remains obstructed on one left side where nasal polyp was seen in the past. She frequently uses tissues. Allergy  testing returned negative results.  She is currently taking Xyzal  at night and has been advised to stop taking Zyrtec  during the day. She has been prescribed Atrovent  nasal spray and another nasal spray, but she is not using Flonase . She continues to use saline packets for nasal irrigation.  She experiences occasional headaches, which have improved but still occur. Recently, she had a headache accompanied by ear pain at the beginning of the week. She noticed some yellow discharge when blowing her nose, which was the first occurrence in a while. She continues to experience significant nasal drainage, which she describes as frustrating.   Records Reviewed:  Initial Evaluation  Reason for Consult: sinusitis    HPI: Discussed the use of AI scribe software for clinical note transcription with the patient, who gave verbal consent to proceed.  History of Present Illness Ashley Wagner is a 48 year old female with hx of Eustachian tube dysfunction, s/p left PET under anesthesia, who presents with persistent chronic nasal congestion, nasal drainage and headaches.   She has been experiencing persistent nasal drainage from the left side of her nose, which began after a flight in early March 2025 (went to [place]). The drainage has been continuous for four to five weeks and is accompanied by blood-tinged secretions and headaches, particularly in the back of her head. The headaches are described as a pressure sensation at the back and  top of her head. Leaning forward or bending down exacerbates the nasal drainage, often resulting in secretions falling onto her clothing.  She has a history of Eustachian tube dysfunction and had a tube placed in her left ear last year due to pressure issues during flights. The tube has helped, but she occasionally feels fluid in the ear. During her recent flight, she experienced a headache but no ear pressure.  She uses a steroid nasal spray and takes Zyrtec  daily for allergies. She previously used Claritin D but reduced its use due to concerns about frequent decongestant use.   Upon return from [place], she was initially treated with amoxicillin , which was later changed to Augmentin  and prednisone  when symptoms worsened. The prednisone  provided some relief, but symptoms persisted after completion. She has not been tested for allergies but assumes she has them. CT sinuses showed some mucosal thickening in left maxillary sinus, otherwise clear paranasal sinuses.  No history of nasal or sinus procedures.   Records Reviewed:  PCP visit 11/12/23 Dr Duanne  Left maxillary sinusitis   Begin Augmentin  875 mg twice daily with a prednisone  taper pack.  Reassess in 1 week if no better or sooner if worse   Past Medical History:  Diagnosis Date   Diverticulosis of colon    GERD (gastroesophageal reflux disease)    on meds- and with certain foods   History of cervical dysplasia 2003   CIN I and II  s/p LEEP   History of chlamydia 2000   treated   HSV-2 infection    Hypertension    on meds   Menorrhagia  Uterine fibroid    Wears partial dentures    upper    Past Surgical History:  Procedure Laterality Date   CESAREAN SECTION  2004   COLONOSCOPY WITH ESOPHAGOGASTRODUODENOSCOPY (EGD)  2017  dr abran   DILATATION & CURETTAGE/HYSTEROSCOPY WITH TRUECLEAR N/A 09/07/2014   Procedure: DILATATION & CURETTAGE/HYSTEROSCOPY WITH TRUCLEAR;  Surgeon: Ovid All, MD;  Location: [facility];  Service:  Gynecology;  Laterality: N/A;   DILITATION & CURRETTAGE/HYSTROSCOPY WITH NOVASURE ABLATION N/A 01/09/2021   Procedure: HYSTEROSCOPY WITH HYDROTHERMAL ABLATION;  Surgeon: All Ovid, MD;  Location: [facility];  Service: Gynecology;  Laterality: N/A;   LAPAROSCOPIC BILATERAL SALPINGECTOMY Bilateral 01/09/2021   Procedure: LAPAROSCOPIC BILATERAL SALPINGECTOMY;  Surgeon: All Ovid, MD;  Location: [facility];  Service: Gynecology;  Laterality: Bilateral;   LAPAROSCOPIC CHOLECYSTECTOMY  07/30/15   LEEP  2003   MYRINGOTOMY WITH TUBE PLACEMENT Left 08/23/2023   Procedure: LEFT MYRINGOTOMY WITH TUBE PLACEMENT;  Surgeon: Jesus Oliphant, MD;  Location: [facility];  Service: ENT;  Laterality: Left;    Family History  Problem Relation Age of Onset   Colon polyps Mother    Depression Mother    Cancer Father 95       Colon   Arthritis Father    Hypertension Father    Colon polyps Father 76   Colon cancer Father 74   Urticaria Sister    Eczema Sister    Arthritis Sister    Depression Brother    Drug abuse Brother    Hypertension Brother    Colon polyps Paternal Grandmother    Colon cancer Paternal Grandmother        dx when young and died   Esophageal cancer Neg Hx    Stomach cancer Neg Hx    Rectal cancer Neg Hx     Social History:  reports that she quit smoking about 22 years ago. Her smoking use included cigarettes. She started smoking about 26 years ago. She has never used smokeless tobacco. She reports current alcohol use of about 1.0 standard drink of alcohol per week. She reports that she does not use drugs.  Allergies: No Known Allergies  Medications: I have reviewed the patient's current medications.  The PMH, PSH, Medications, Allergies, and SH were reviewed and updated.  ROS: Constitutional: Negative for fever, weight loss and weight gain. Cardiovascular: Negative for chest pain and dyspnea on exertion. Respiratory: Is not  experiencing shortness of breath at rest. Gastrointestinal: Negative for nausea and vomiting. Neurological: Negative for headaches. Psychiatric: The patient is not nervous/anxious  There were no vitals taken for this visit. There is no height or weight on file to calculate BMI.  PHYSICAL EXAM:  Exam: General: Well-developed, well-nourished Respiratory Respiratory effort: Equal inspiration and expiration without stridor Cardiovascular Peripheral Vascular: Warm extremities with equal color/perfusion Eyes: No nystagmus with equal extraocular motion bilaterally Neuro/Psych/Balance: Patient oriented to person, place, and time; Appropriate mood and affect; Gait is intact with no imbalance; Cranial nerves I-XII are intact Head and Face Inspection: Normocephalic and atraumatic without mass or lesion Palpation: Facial skeleton intact without bony stepoffs Salivary Glands: No mass or tenderness Facial Strength: Facial motility symmetric and full bilaterally ENT Pinna: External ear intact and fully developed External canal: Canal is patent with intact skin Tympanic Membrane: Clear and mobile, L TM with PET in place, patent, in good position External Nose: No scar or anatomic deformity Internal Nose: Septum is deviated to the left with significant narrowing of the left  nasal passage. Previously seen large large polyp in the left middle meatus, but no purulence. Mucosal edema and erythema present.  Bilateral inferior turbinate hypertrophy.  Lips, Teeth, and gums: Mucosa and teeth intact and viable TMJ: No pain to palpation with full mobility Oral cavity/oropharynx: No erythema or exudate, no lesions present Nasopharynx: No mass or lesion with intact mucosa, scant clear secretions Neck Neck and Trachea: Midline trachea without mass or lesion Thyroid: No mass or nodularity Lymphatics: No lymphadenopathy  Procedure:   PROCEDURE NOTE: nasal endoscopy  Preoperative diagnosis: chronic  sinusitis symptoms  Postoperative diagnosis: same, no purulence noted, left nasal polyp  Procedure: Diagnostic nasal endoscopy (68768)  Surgeon: Elena Larry, M.D.  Anesthesia: Topical lidocaine  and Afrin  H&P REVIEW: The patient's history and physical were reviewed today prior to procedure. All medications were reviewed and updated as well. Complications: None Condition is stable throughout exam Indications and consent: The patient presents with symptoms of chronic sinusitis not responding to previous therapies. All the risks, benefits, and potential complications were reviewed with the patient preoperatively and informed consent was obtained. The time out was completed with confirmation of the correct procedure.   Procedure: The patient was seated upright in the clinic. Topical lidocaine  and Afrin were applied to the nasal cavity. After adequate anesthesia had occurred, the rigid nasal endoscope was passed into the nasal cavity. The nasal mucosa, turbinates, septum, and sinus drainage pathways were visualized bilaterally. This revealed no purulence or significant secretions that might be cultured. There was a polyp at the left middle meatus. The mucosa was intact and there was no crusting present. The scope was then slowly withdrawn and the patient tolerated the procedure well. There were no complications or blood loss.   Studies Reviewed: 11/23/23 CT sinuses FINDINGS: Paranasal sinuses:   Frontal: Normally aerated. Patent frontal sinus drainage pathways.   Ethmoid: Partial opacification of the left ethmoid air cells.   Maxillary: Mucosal thickening and retained secretions within the left maxillary sinus. Normal right maxillary sinus.   Sphenoid: Normally aerated. Patent sphenoethmoidal recesses.   Right ostiomeatal unit: Patent.   Left ostiomeatal unit: Patent.   Nasal passages: Patent. Mild leftward septal deviation   Anatomy: No pneumatization superior to anterior ethmoid  notches. Symmetric and intact olfactory grooves and fovea ethmoidalis, Keros III (8-54mm). Conchal sphenoid pneumatization pattern. No dehiscence of carotid or optic canals. No onodi cell.   Other: Orbits and intracranial compartment are unremarkable. Visible mastoid air cells are normally aerated.   IMPRESSION: 1. Mucosal thickening and retained secretions within the left maxillary sinus and partial opacification of the left ethmoid air cells. 2. Patent sinus drainage pathways. 3. Mild leftward septal deviation.     Assessment/Plan: Encounter Diagnoses  Name Primary?   Chronic nasal congestion Yes   Hypertrophy of both inferior nasal turbinates    Environmental allergies    Nasal polyp    Nasal septal deviation    Post-nasal drip      Assessment and Plan Assessment & Plan Nasal polyp on nasal endoscopy Left nasal polyp near sinus drainage area (left middle meatus) may explain isolated left maxillary sinusitis on CT max/face she had done recently. The rest of paranasal sinuses were clear on the scan. Not large enough for surgery, and no other polyps noted on nasal endoscopy. Allergy  testing recommended. - Prescribe steroid nasal rinses from a compounding pharmacy. - Refer to an allergist for allergy  testing and potential allergy  shots. - Continue Zyrtec  10 mg daily. Rx sent - Consider repeat  sinus scan if symptoms do not improve to determine if left maxillary sinus will resolve or persist - Educate on proper nasal spray technique to avoid epistaxis. Aim laterally  Chronic nasal congestion and Septal Deviation to the left ITH Chronic congestion cause persistent symptoms despite Zyrtec  and nasal sprays. Allergy  testing needed for potential allergy  shots. - Refer to an allergist for allergy  testing and potential allergy  shots. - Continue Zyrtec  10 mg daily. - Initiate steroid nasal rinses. Rx sent to compounding pharmacy.  Eustachian tube dysfunction Suspected due to ear  pressure issues and fluid sensation. Linked to chronic congestion and allergies. Ear tubes alleviate pressure symptoms. She had a left sided PET placed, and it helps her sx. PET patent on exam today.  - Continue allergy  management with Zyrtec  and nasal rinses.  Headaches - top and back of her head. We discussed that these are not likely related to her sinuses as she did not have CRS on her CT max/face - Consult primary care or neurology if headaches persist.  Aerosinusitis Headaches correlate with atmospheric pressure changes (flying), indicative of air sinusitis. Manage with allergy  medications and symptomatic treatment. - Continue allergy  medications and nasal rinses. - Consider Sudafed or Mucinex for severe symptoms. - Consult primary care or neurology if headaches persist.  Update 06/01/24 Left nasal polyp with chronic nasal congestion and postnasal drip Report having yellow nasal drainage when blowing. Nasal endoscopy without purulence. We discussed repeat CT sinuses and she would like to have it done. Allergy  testing negative. - Continue Atrovent  nasal spray. - Prescribed budesonide for saline irrigation. - Order CT nasal sinuses. - Call with imaging results for further management. Will consider nasal polypectomy possible FESS based on imaging results    Elena Larry, MD Otolaryngology [facility] Phone: [phone] Fax: [phone]    06/01/2024, 8:09 AM

## 2024-06-01 NOTE — Progress Notes (Signed)
 Patient just took BP meds.

## 2024-07-12 ENCOUNTER — Other Ambulatory Visit: Payer: Self-pay

## 2024-07-12 ENCOUNTER — Ambulatory Visit: Admitting: Internal Medicine

## 2024-07-12 VITALS — BP 120/82 | HR 71 | Temp 98.0°F | Resp 18 | Ht 68.0 in | Wt 225.7 lb

## 2024-07-12 DIAGNOSIS — J310 Chronic rhinitis: Secondary | ICD-10-CM

## 2024-07-12 DIAGNOSIS — J329 Chronic sinusitis, unspecified: Secondary | ICD-10-CM

## 2024-07-12 DIAGNOSIS — J330 Polyp of nasal cavity: Secondary | ICD-10-CM

## 2024-07-12 MED ORDER — LEVOCETIRIZINE DIHYDROCHLORIDE 5 MG PO TABS
5.0000 mg | ORAL_TABLET | Freq: Every evening | ORAL | 1 refills | Status: AC
Start: 2024-07-12 — End: ?

## 2024-07-12 MED ORDER — RYALTRIS 665-25 MCG/ACT NA SUSP: 2.0000 | Freq: Two times a day (BID) | NASAL | 5 refills | Status: AC

## 2024-07-12 MED ORDER — IPRATROPIUM BROMIDE 0.06 % NA SOLN: 2.0000 | Freq: Three times a day (TID) | NASAL | 5 refills | Status: AC | PRN

## 2024-07-12 NOTE — Patient Instructions (Addendum)
 Chronic Rhinitis Nasal Polyp  - Due to persistent symptoms, will do allergy  testing by bloodwork   - Positive skin test 03/2024: none - Use nasal saline rinses before nose sprays such as with Neilmed Sinus Rinse.  Use distilled water.   - Use Ryaltris 665-25mcg 2 sprays each nostril twice daily.  Aim upward and outward.  Stop Nasacort /Azelastine  once you start this nose spray.   - Use Ipratropium 0.06% 1-2 sprays each nostril three times a day for runny nose as needed.  Aim upward and outward.  - Use Xyzal  5 mg daily.  - Followed by [facility].    Recurrent Sinus Infection - Normal Ig, protein vaccine response, low polysaccharide response.  Pneumovax given 8/29.  Will recheck S pneumo titers today.

## 2024-07-12 NOTE — Progress Notes (Signed)
   FOLLOW UP Date of Service/Encounter:  07/12/24   Subjective:  Ashley Wagner (DOB: 1976-04-11) is a 48 y.o. female [facility] for follow up for chronic rhinitis and recurrent sinus infections.   History obtained from: chart review and patient. Last visit was with me on 04/11/2024 and at the time, SPT negative to aeroallergens.  Low S pneumo titers with plans to get pneumovax and recheck titers.  Reports still having trouble with congestion and significant rhinorrhea and post nasal drainage. Seen by ENT and noted to have a polyp but not significant size.  Discussed considering surgery but not sure if that would help with rhinorrhea/drainage.  They are planning on repeating CT.  She is taking Azelastine , Atrovent  and Xyzal  with minimal relief.   No antibiotic use, sinus infections since last visit.  Pneumovax given 8/29 but never went to get blood drawn to recheck titers.   Past Medical History: Past Medical History:  Diagnosis Date   Diverticulosis of colon    GERD (gastroesophageal reflux disease)    on meds- and with certain foods   History of cervical dysplasia 2003   CIN I and II  s/p LEEP   History of chlamydia 2000   treated   HSV-2 infection    Hypertension    on meds   Menorrhagia    Uterine fibroid    Wears partial dentures    upper    Objective:  BP 120/82 (BP Location: Right Arm, Patient Position: Sitting, Cuff Size: Normal)   Pulse 71   Temp 98 F (36.7 C) (Temporal)   Resp 18   Ht 5' 8 (1.727 m)   Wt 225 lb 11.2 oz (102.4 kg)   SpO2 98%   BMI 34.32 kg/m  Body mass index is 34.32 kg/m. Physical Exam: GEN: alert, well developed HEENT: clear conjunctiva, nose with mild inferior turbinate hypertrophy, pink nasal mucosa, + clear rhinorrhea, + cobblestoning HEART: regular rate and rhythm, no murmur LUNGS: clear to auscultation bilaterally, no coughing, unlabored respiration SKIN: no rashes or  lesions  Assessment:   1. Recurrent sinus infections   2. Chronic rhinitis   3. Polyp of nasal cavity     Plan/Recommendations:  Chronic Rhinitis Nasal Polyp  - Uncontrolled.  Due to persistent symptoms, will do allergy  testing by bloodwork   - Positive skin test 03/2024: none - Use nasal saline rinses before nose sprays such as with Neilmed Sinus Rinse.  Use distilled water.   - Use Ryaltris 665-25mcg 2 sprays each nostril twice daily.  Aim upward and outward.  Stop Nasacort /Azelastine  once you start this nose spray.   - Use Ipratropium 0.06% 1-2 sprays each nostril three times a day for runny nose as needed.  Aim upward and outward.  - Use Xyzal  5 mg daily.  - Followed by [facility].   Recurrent Sinus Infection - Normal Ig, protein vaccine response, low polysaccharide response.  Pneumovax given 8/29.  Will recheck S pneumo titers today.      Return in about 3 months (around 10/12/2024).  Arleta Blanch, MD [facility]

## 2024-07-19 LAB — ALLERGENS W/TOTAL IGE AREA 2

## 2024-07-19 LAB — STREP PNEUMONIAE 23 SEROTYPES IGG
Pneumo Ab Type 1*: 1.5 ug/mL (ref >1.3)
Pneumo Ab Type 12 (12F)*: 5.2 ug/mL (ref >1.3)
Pneumo Ab Type 14*: 0.4 ug/mL — AB (ref >1.3)
Pneumo Ab Type 17 (17F)*: 2.4 ug/mL (ref >1.3)
Pneumo Ab Type 19 (19F)*: 3.2 ug/mL (ref >1.3)
Pneumo Ab Type 2*: 20.7 ug/mL (ref >1.3)
Pneumo Ab Type 20*: 5.4 ug/mL (ref >1.3)
Pneumo Ab Type 22 (22F)*: 1 ug/mL — AB (ref >1.3)
Pneumo Ab Type 23 (23F)*: 1 ug/mL — AB (ref >1.3)
Pneumo Ab Type 26 (6B)*: 0.3 ug/mL — AB (ref >1.3)
Pneumo Ab Type 3*: 0.3 ug/mL — AB (ref >1.3)
Pneumo Ab Type 34 (10A)*: 3.2 ug/mL (ref >1.3)
Pneumo Ab Type 4*: 0.7 ug/mL — AB (ref >1.3)
Pneumo Ab Type 43 (11A)*: 1.3 ug/mL — AB (ref >1.3)
Pneumo Ab Type 5*: 1.2 ug/mL — AB (ref >1.3)
Pneumo Ab Type 51 (7F)*: 1.6 ug/mL (ref >1.3)
Pneumo Ab Type 54 (15B)*: 1.6 ug/mL (ref >1.3)
Pneumo Ab Type 56 (18C)*: 0.9 ug/mL — AB (ref >1.3)
Pneumo Ab Type 57 (19A)*: 2.9 ug/mL (ref >1.3)
Pneumo Ab Type 68 (9V)*: 1.4 ug/mL (ref >1.3)
Pneumo Ab Type 70 (33F)*: 2.3 ug/mL (ref >1.3)
Pneumo Ab Type 8*: 3.7 ug/mL (ref >1.3)
Pneumo Ab Type 9 (9N)*: 0.3 ug/mL — AB (ref >1.3)

## 2024-07-24 ENCOUNTER — Ambulatory Visit: Payer: Self-pay | Admitting: Internal Medicine

## 2024-08-02 ENCOUNTER — Encounter (INDEPENDENT_AMBULATORY_CARE_PROVIDER_SITE_OTHER): Payer: Self-pay | Admitting: Otolaryngology

## 2024-08-03 ENCOUNTER — Other Ambulatory Visit: Payer: Self-pay

## 2024-08-03 ENCOUNTER — Telehealth: Payer: Self-pay

## 2024-08-03 MED ORDER — ESCITALOPRAM OXALATE 10 MG PO TABS: 10.0000 mg | ORAL_TABLET | Freq: Every day | ORAL | 1 refills | Status: AC

## 2024-08-03 NOTE — Telephone Encounter (Signed)
 Prescription Request  08/03/2024  LOV: 05/08/24  What is the name of the medication or equipment? escitalopram  (LEXAPRO ) 10 MG tablet [[id number]]   Have you contacted your pharmacy to request a refill? Yes   Which pharmacy would you like this sent to?  [id number] - [place], [place] - [address] AT [facility] [address] [place] [address] Phone: [phone] Fax: [phone]    Patient notified that their request is being sent to the clinical staff for review and that they should receive a response within 2 business days.   Please advise at [facility] [phone]

## 2024-08-03 NOTE — Telephone Encounter (Signed)
 Sent in medication

## 2024-08-04 ENCOUNTER — Encounter: Payer: Self-pay | Admitting: Family Medicine

## 2024-08-09 ENCOUNTER — Inpatient Hospital Stay: Admission: RE | Admit: 2024-08-09 | Discharge: 2024-08-09 | Attending: Otolaryngology

## 2024-08-09 ENCOUNTER — Encounter: Payer: Self-pay | Admitting: Family Medicine

## 2024-08-09 DIAGNOSIS — J320 Chronic maxillary sinusitis: Secondary | ICD-10-CM | POA: Diagnosis not present

## 2024-08-09 DIAGNOSIS — J339 Nasal polyp, unspecified: Secondary | ICD-10-CM

## 2024-08-14 ENCOUNTER — Encounter: Payer: Self-pay | Admitting: Family Medicine

## 2024-08-14 ENCOUNTER — Inpatient Hospital Stay: Admission: RE | Admit: 2024-08-14 | Discharge: 2024-08-14 | Attending: Family Medicine | Admitting: Family Medicine

## 2024-08-14 DIAGNOSIS — M4802 Spinal stenosis, cervical region: Secondary | ICD-10-CM | POA: Diagnosis not present

## 2024-08-14 DIAGNOSIS — M47812 Spondylosis without myelopathy or radiculopathy, cervical region: Secondary | ICD-10-CM | POA: Diagnosis not present

## 2024-08-14 DIAGNOSIS — M542 Cervicalgia: Secondary | ICD-10-CM | POA: Diagnosis not present

## 2024-08-15 ENCOUNTER — Other Ambulatory Visit: Payer: Self-pay | Admitting: Family Medicine

## 2024-08-15 DIAGNOSIS — Q019 Encephalocele, unspecified: Secondary | ICD-10-CM

## 2024-08-21 ENCOUNTER — Ambulatory Visit (INDEPENDENT_AMBULATORY_CARE_PROVIDER_SITE_OTHER): Admitting: Otolaryngology

## 2024-08-21 ENCOUNTER — Encounter (INDEPENDENT_AMBULATORY_CARE_PROVIDER_SITE_OTHER): Payer: Self-pay | Admitting: Otolaryngology

## 2024-08-21 VITALS — BP 165/97 | HR 97 | Ht 68.0 in | Wt 225.0 lb

## 2024-08-21 DIAGNOSIS — J3489 Other specified disorders of nose and nasal sinuses: Secondary | ICD-10-CM | POA: Diagnosis not present

## 2024-08-21 DIAGNOSIS — G9601 Cranial cerebrospinal fluid leak, spontaneous: Secondary | ICD-10-CM | POA: Diagnosis not present

## 2024-08-21 DIAGNOSIS — G932 Benign intracranial hypertension: Secondary | ICD-10-CM | POA: Diagnosis not present

## 2024-08-21 DIAGNOSIS — Q019 Encephalocele, unspecified: Secondary | ICD-10-CM

## 2024-08-21 DIAGNOSIS — R0981 Nasal congestion: Secondary | ICD-10-CM | POA: Diagnosis not present

## 2024-08-21 NOTE — Progress Notes (Addendum)
 Dear Dr. Duanne, Here is my assessment for our mutual patient, Ashley Wagner. Thank you for allowing me the opportunity to care for your patient. Please do not hesitate to contact me should you have any other questions. Sincerely, Dr. Eldora Blanch  Otolaryngology Clinic Note Referring provider: Dr. Duanne HPI:  Ashley Wagner is a 48 y.o. female kindly referred by Dr. Duanne for evaluation of possible encephalocele  Initial visit (07/2024): Discussed the use of AI scribe software for clinical note transcription with the patient, who gave verbal consent to proceed. History of Present Illness Ashley Wagner is a 48 year old female with who presents with chronic left-sided clear nasal drainage (ongoing for at least a year).  For approximately one year, she has had persistent clear, watery rhinorrhea from the left naris, worsened by leaning forward or bending and sometimes occurring spontaneously with a salty or metallic taste.  She has intermittent left frontal headaches, occasional facial pain or pressure (mostly around cheek but rare), and intermittent nasal congestion. No antecedent event. She denies diplopia, prior sinus surgery, or nasal trauma. No change in sense of smell. No meningitis sx.   She has seen Allergy  as well with prior allergy  testing, which did not show any significant positives. She has tried INCS, atrovent , PO antihistamine and nasal rinses without relief. She has also been on abx and has had a CT and steroids.   She denies sudden neurological deficits, loss of consciousness, or seizures. Of note, she has also has had left sided ear drainage for which she underwent left tymp tube placement by Dr. Jesus about a year ago. She is not having any ear pressure or drainage from ear.   ENT Surgery: Left ear tube (2024) Personal or FHx of bleeding dz or anesthesia difficulty: no  AP/AC: no  Tobacco: former, quit  PMHx:  HTN Independent Review of Additional Tests or Records:   CT Sinus 08/13/2024: noted left cricibriform and left olfactory fossa likely dehiscence and likely encephcocele. Partially empty sella. Mastoids without obvious opacification or large encephalocele but I wonder if there is a small mastoid encephalocele on left as well on coronals    PMH/Meds/All/SocHx/FamHx/ROS:   Past Medical History:  Diagnosis Date   Diverticulosis of colon    GERD (gastroesophageal reflux disease)    on meds- and with certain foods   History of cervical dysplasia 2003   CIN I and II  s/p LEEP   History of chlamydia 2000   treated   HSV-2 infection    Hypertension    on meds   Menorrhagia    Uterine fibroid    Wears partial dentures    upper     Past Surgical History:  Procedure Laterality Date   CESAREAN SECTION  2004   COLONOSCOPY WITH ESOPHAGOGASTRODUODENOSCOPY (EGD)  2017  dr abran   DILATATION & CURETTAGE/HYSTEROSCOPY WITH TRUECLEAR N/A 09/07/2014   Procedure: DILATATION & CURETTAGE/HYSTEROSCOPY WITH TRUCLEAR;  Surgeon: Ovid All, MD;  Location: [facility];  Service: Gynecology;  Laterality: N/A;   DILITATION & CURRETTAGE/HYSTROSCOPY WITH NOVASURE ABLATION N/A 01/09/2021   Procedure: HYSTEROSCOPY WITH HYDROTHERMAL ABLATION;  Surgeon: All Ovid, MD;  Location: [facility];  Service: Gynecology;  Laterality: N/A;   LAPAROSCOPIC BILATERAL SALPINGECTOMY Bilateral 01/09/2021   Procedure: LAPAROSCOPIC BILATERAL SALPINGECTOMY;  Surgeon: All Ovid, MD;  Location: [facility];  Service: Gynecology;  Laterality: Bilateral;   LAPAROSCOPIC CHOLECYSTECTOMY  07/30/15   LEEP  2003   MYRINGOTOMY WITH TUBE PLACEMENT Left 08/23/2023  Procedure: LEFT MYRINGOTOMY WITH TUBE PLACEMENT;  Surgeon: Jesus Oliphant, MD;  Location: [facility];  Service: ENT;  Laterality: Left;    Family History  Problem Relation Age of Onset   Colon polyps Mother    Depression Mother    Cancer Father 65       Colon   Arthritis Father     Hypertension Father    Colon polyps Father 14   Colon cancer Father 27   Urticaria Sister    Eczema Sister    Arthritis Sister    Depression Brother    Drug abuse Brother    Hypertension Brother    Colon polyps Paternal Grandmother    Colon cancer Paternal Grandmother        dx when young and died   Esophageal cancer Neg Hx    Stomach cancer Neg Hx    Rectal cancer Neg Hx      Social Connections: Not on file     Current Medications[1]   Physical Exam:   BP (!) 165/97 (BP Location: Left Arm, Patient Position: Sitting, Cuff Size: Large)   Pulse 97   Ht 5' 8 (1.727 m)   Wt 225 lb (102.1 kg)   SpO2 (!) 89%   BMI 34.21 kg/m   Salient findings:  CN II-XII intact No diplopia on gross counting Bilateral EAC clear and TM intact with well pneumatized middle ear spaces - on left PE tube patent and dry, no pulsatile drainage Anterior rhinoscopy: Septum deviates left; bilateral inferior turbinates with out significant hypertrophy; left thin watery rhinorrhea on tilt testing --- collected and will send for B2 transferrin testing; Nasal endoscopy was indicated to better evaluate the nose and paranasal sinuses, given the patient's history and exam findings, and is detailed below. No lesions of oral cavity/oropharynx No obviously palpable neck masses/lymphadenopathy/thyromegaly No respiratory distress or stridor BMI 34  Seprately Identifiable Procedures:  Prior to initiating any procedures, risks/benefits/alternatives were explained to the patient and verbal consent obtained. PROCEDURE: Bilateral Diagnostic Rigid Nasal Endoscopy Pre-procedure diagnosis: Concern for cerebrospinal fluid rhinorrhea, nasal mass Post-procedure diagnosis: same Indication: See pre-procedure diagnosis and physical exam above Complications: None apparent EBL: 0 mL Anesthesia: Lidocaine  4% and topical decongestant was topically sprayed in each nasal cavity  Description of Procedure:  Patient was identified.  A rigid 30 degree endoscope was utilized to evaluate the sinonasal cavities, mucosa, sinus ostia and turbinates and septum.  Overall, signs of mucosal inflammation are not noted.  No mucopurulence, polyps, noted.   Right Middle meatus: clear Right SE Recess: clear Left MM: clear Left SE Recess: clear Appears to have encephalocele v/s meningocele which is apparent likely from left olfactory cleft medial to left MT  Photodocumentation was obtained.  CPT CODE -- 68768 - Mod 25   Impression & Plans:  Dolores Moodie is a 48 y.o. female with:  1. Cerebrospinal fluid rhinorrhea   2. Nasal encephalocele (HCC)   3. Chronic nasal congestion   4. IIH (idiopathic intracranial hypertension)    Likely encephalocele v/s meningocele causing her symptoms. Clear CSF which is coming from left and I collected this and will send for B2 transferrin testing  We discussed that given this and her other symptoms, she may have IIH which likely would need multimodal treatment from possible NSGY standpoint as well. 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  We discussed options and I'll stop her atrovent  spray (not allergic this seems)  and will refer to [facility] for endoscopic repair and further workup. She understands she may need NSGY eval and ophtho for IIH as well. Makes most sense to do it in one place.  - f/u in 10 days phone visit with me to discuss B2 results - Return precautions for meningitis discussed  See below regarding exact medications prescribed this encounter including dosages and route: No orders of the defined types were placed in this encounter.     Thank you for allowing me the opportunity to care for your patient. Please do not hesitate to contact me should you have any other questions.  Sincerely, Eldora Blanch, MD Phone: [phone] Fax: [phone]  08/21/2024, 6:12 PM   MDM:  I have personally spent 45 minutes involved in  face-to-face and non-face-to-face activities for this patient on the day of the visit.  Professional time spent excludes any procedures performed but includes the following activities, in addition to those noted in the documentation: preparing to see the patient (review of outside documentation and results), performing a medically appropriate examination, counseling, documenting in the electronic health record, independently interpreting results (CT).       [1]  Current Outpatient Medications:    budesonide  (PULMICORT ) 0.5 MG/2ML nebulizer solution, Take 2 mLs (0.5 mg total) by nebulization in the morning and at bedtime. MIX 1 VIAL WITH 250CC OF NORMAL SALINE FOR SINUS WASH DAILY AS NEEDED, Disp: 120 mL, Rfl: 12   cetirizine  (ZYRTEC ) 10 MG tablet, Take 1 tablet (10 mg total) by mouth daily., Disp: 30 tablet, Rfl: 11   cyclobenzaprine  (FLEXERIL ) 10 MG tablet, Take 1 tablet (10 mg total) by mouth 3 (three) times daily as needed for muscle spasms., Disp: 30 tablet, Rfl: 0   escitalopram  (LEXAPRO ) 10 MG tablet, Take 1 tablet (10 mg total) by mouth daily., Disp: 90 tablet, Rfl: 1   ibuprofen  (ADVIL ) 200 MG tablet, Take 200-600 mg by mouth as needed., Disp: , Rfl:    ipratropium (ATROVENT ) 0.06 % nasal spray, Place 2 sprays into both nostrils 3 (three) times daily as needed for rhinitis., Disp: 15 mL, Rfl: 5   levocetirizine (XYZAL  ALLERGY  24HR) 5 MG tablet, Take 1 tablet (5 mg total) by mouth every evening., Disp: 90 tablet, Rfl: 1   mometasone  (ELOCON ) 0.1 % cream, Apply topically daily. (Patient taking differently: Apply topically as needed.), Disp: 15 g, Rfl: 1   Olopatadine-Mometasone  (RYALTRIS ) 665-25 MCG/ACT SUSP, Place 2 sprays into the nose in the morning and at bedtime., Disp: 29 g, Rfl: 5   predniSONE  (DELTASONE ) 20 MG tablet, 3 tabs poqday 1-2, 2 tabs poqday 3-4, 1 tab poqday 5-6, Disp: 12 tablet, Rfl: 0   valACYclovir (VALTREX) 500 MG tablet, Take 500 mg by mouth daily as needed (outbreaks).  , Disp: , Rfl:    valsartan  (DIOVAN ) 160 MG tablet, Take 1 tablet (160 mg total) by mouth daily., Disp: 90 tablet, Rfl: 1   amoxicillin -clavulanate (AUGMENTIN ) 875-125 MG tablet, Take 1 tablet by mouth 2 (two) times daily. (Patient not taking: Reported on 08/21/2024), Disp: 20 tablet, Rfl: 0   azithromycin  (ZITHROMAX ) 250 MG tablet, 2 tabs poqday1, 1 tab poqday 2-5 (Patient not taking: Reported on 08/21/2024), Disp: 6 tablet, Rfl: 0

## 2024-08-21 NOTE — Patient Instructions (Signed)
 [facility]: [phone]

## 2024-08-22 ENCOUNTER — Other Ambulatory Visit (HOSPITAL_COMMUNITY): Admission: RE | Admit: 2024-08-22 | Discharge: 2024-08-22 | Disposition: A | Discharge status: Home / Self Care

## 2024-08-22 DIAGNOSIS — G9601 Cranial cerebrospinal fluid leak, spontaneous: Secondary | ICD-10-CM | POA: Insufficient documentation

## 2024-08-22 DIAGNOSIS — Q019 Encephalocele, unspecified: Secondary | ICD-10-CM | POA: Insufficient documentation

## 2024-08-22 DIAGNOSIS — R0981 Nasal congestion: Secondary | ICD-10-CM | POA: Insufficient documentation

## 2024-08-28 ENCOUNTER — Ambulatory Visit: Payer: Self-pay | Admitting: Family Medicine

## 2024-08-30 LAB — MISC LABCORP TEST (SEND OUT): Labcorp test code: 9985

## 2024-09-04 ENCOUNTER — Other Ambulatory Visit: Payer: BC Managed Care – PPO

## 2024-09-05 ENCOUNTER — Encounter (INDEPENDENT_AMBULATORY_CARE_PROVIDER_SITE_OTHER): Payer: Self-pay | Admitting: Otolaryngology

## 2024-09-05 ENCOUNTER — Encounter (INDEPENDENT_AMBULATORY_CARE_PROVIDER_SITE_OTHER): Payer: Self-pay

## 2024-09-05 ENCOUNTER — Ambulatory Visit (INDEPENDENT_AMBULATORY_CARE_PROVIDER_SITE_OTHER): Admitting: Otolaryngology

## 2024-09-05 ENCOUNTER — Ambulatory Visit
Admission: RE | Admit: 2024-09-05 | Discharge: 2024-09-05 | Disposition: A | Source: Ambulatory Visit | Attending: Family Medicine | Admitting: Family Medicine

## 2024-09-05 ENCOUNTER — Other Ambulatory Visit

## 2024-09-05 DIAGNOSIS — J343 Hypertrophy of nasal turbinates: Secondary | ICD-10-CM | POA: Diagnosis not present

## 2024-09-05 DIAGNOSIS — Q019 Encephalocele, unspecified: Secondary | ICD-10-CM

## 2024-09-05 DIAGNOSIS — R0981 Nasal congestion: Secondary | ICD-10-CM

## 2024-09-05 DIAGNOSIS — G932 Benign intracranial hypertension: Secondary | ICD-10-CM

## 2024-09-05 DIAGNOSIS — J342 Deviated nasal septum: Secondary | ICD-10-CM

## 2024-09-05 DIAGNOSIS — G9601 Cranial cerebrospinal fluid leak, spontaneous: Secondary | ICD-10-CM | POA: Diagnosis not present

## 2024-09-05 LAB — CBC WITH DIFFERENTIAL/PLATELET
Absolute Lymphocytes: 2340 {cells}/uL (ref 850–3900)
Absolute Monocytes: 432 {cells}/uL (ref 200–950)
Basophils Absolute: 29 {cells}/uL (ref 0–200)
Basophils Relative: 0.4 %
Eosinophils Absolute: 288 {cells}/uL (ref 15–500)
Eosinophils Relative: 4 %
HCT: 40.1 % (ref 35.9–46.0)
Hemoglobin: 12.7 g/dL (ref 11.7–15.5)
MCH: 30.6 pg (ref 27.0–33.0)
MCHC: 31.7 g/dL (ref 31.6–35.4)
MCV: 96.6 fL (ref 81.4–101.7)
MPV: 10.1 fL (ref 7.5–12.5)
Monocytes Relative: 6 %
Neutro Abs: 4111 {cells}/uL (ref 1500–7800)
Neutrophils Relative %: 57.1 %
Platelets: 354 Thousand/uL (ref 140–400)
RBC: 4.15 Million/uL (ref 3.80–5.10)
RDW: 12.1 % (ref 11.0–15.0)
Total Lymphocyte: 32.5 %
WBC: 7.2 Thousand/uL (ref 3.8–10.8)

## 2024-09-05 LAB — COMPLETE METABOLIC PANEL WITHOUT GFR
AG Ratio: 1.3 (calc) (ref 1.0–2.5)
ALT: 12 U/L (ref 6–29)
AST: 15 U/L (ref 10–35)
Albumin: 3.9 g/dL (ref 3.6–5.1)
Alkaline phosphatase (APISO): 66 U/L (ref 31–125)
BUN: 12 mg/dL (ref 7–25)
CO2: 29 mmol/L (ref 20–32)
Calcium: 8.9 mg/dL (ref 8.6–10.2)
Chloride: 105 mmol/L (ref 98–110)
Creat: 0.87 mg/dL (ref 0.50–0.99)
Globulin: 2.9 g/dL (ref 1.9–3.7)
Glucose, Bld: 84 mg/dL (ref 65–99)
Potassium: 4.8 mmol/L (ref 3.5–5.3)
Sodium: 141 mmol/L (ref 135–146)
Total Bilirubin: 0.4 mg/dL (ref 0.2–1.2)
Total Protein: 6.8 g/dL (ref 6.1–8.1)

## 2024-09-05 LAB — LIPID PANEL
Cholesterol: 181 mg/dL
HDL: 74 mg/dL
LDL Cholesterol (Calc): 93 mg/dL
Non-HDL Cholesterol (Calc): 107 mg/dL
Total CHOL/HDL Ratio: 2.4 (calc)
Triglycerides: 55 mg/dL

## 2024-09-05 MED ORDER — GADOPICLENOL 0.5 MMOL/ML IV SOLN
10.0000 mL | Freq: Once | INTRAVENOUS | Status: AC | PRN
  Administered 2024-09-05: 10 mL via INTRAVENOUS

## 2024-09-05 NOTE — Progress Notes (Signed)
 Dear Dr. Duanne, Here is my assessment for our mutual patient, Ashley Wagner. Thank you for allowing me the opportunity to care for your patient. Please do not hesitate to contact me should you have any other questions. Sincerely, Dr. Eldora Blanch  Otolaryngology Clinic Note Referring provider: Dr. Duanne HPI:  Ashley Wagner is a 49 y.o. female kindly referred by Dr. Duanne for evaluation of possible encephalocele  Initial visit (07/2024): Discussed the use of AI scribe software for clinical note transcription with the patient, who gave verbal consent to proceed. History of Present Illness [name] is a 49 year old female with who presents with chronic left-sided clear nasal drainage (ongoing for at least a year).  For approximately one year, she has had persistent clear, watery rhinorrhea from the left naris, worsened by leaning forward or bending and sometimes occurring spontaneously with a salty or metallic taste.  She has intermittent left frontal headaches, occasional facial pain or pressure (mostly around cheek but rare), and intermittent nasal congestion. No antecedent event. She denies diplopia, prior sinus surgery, or nasal trauma. No change in sense of smell. No meningitis sx.   She has seen Allergy  as well with prior allergy  testing, which did not show any significant positives. She has tried INCS, atrovent , PO antihistamine and nasal rinses without relief. She has also been on abx and has had a CT and steroids.   She denies sudden neurological deficits, loss of consciousness, or seizures. Of note, she has also has had left sided ear drainage for which she underwent left tymp tube placement by Dr. Jesus about a year ago. She is not having any ear pressure or drainage from ear.   --------------------------------------------------------- 09/05/2024 The patient gave consent to have this visit done by telemedicine / virtual visit, two identifiers were used to identify patient.  This is also consent for access the chart and treat the patient via this visit. The patient is located in [place] .  I, the provider, am at the office.  We spent 8 minutes together for the visit.  Joined by telephone  She had an MRI done today and we did discuss those results (official read not back yet). She is not having any headaches. B2 transferrin testing was + for CSF.SABRA  ENT Surgery: Left ear tube (2024) Personal or FHx of bleeding dz or anesthesia difficulty: no  AP/AC: no  Tobacco: former, quit  PMHx:  HTN Independent Review of Additional Tests or Records:  CT Sinus 08/13/2024: noted left cricibriform and left olfactory fossa likely dehiscence and likely encephcocele. Partially empty sella. 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; no obvious mastoid effusion appreciated, likely small encephalocele left mastoid  PMH/Meds/All/SocHx/FamHx/ROS:   Past Medical History:  Diagnosis Date   Diverticulosis of colon    GERD (gastroesophageal reflux disease)    on meds- and with certain foods   History of cervical dysplasia 2003   CIN I and II  s/p LEEP   History of chlamydia 2000   treated   HSV-2 infection    Hypertension    on meds   Menorrhagia    Uterine fibroid    Wears partial dentures    upper     Past Surgical History:  Procedure Laterality Date   CESAREAN  SECTION  2004   COLONOSCOPY WITH ESOPHAGOGASTRODUODENOSCOPY (EGD)  2017  dr abran   DILATATION & CURETTAGE/HYSTEROSCOPY WITH TRUECLEAR N/A 09/07/2014   Procedure: DILATATION & CURETTAGE/HYSTEROSCOPY WITH TRUCLEAR;  Surgeon: Ovid All, MD;  Location: [facility];  Service: Gynecology;  Laterality: N/A;   DILITATION & CURRETTAGE/HYSTROSCOPY WITH NOVASURE  ABLATION N/A 01/09/2021   Procedure: HYSTEROSCOPY WITH HYDROTHERMAL ABLATION;  Surgeon: All Ovid, MD;  Location: [facility];  Service: Gynecology;  Laterality: N/A;   LAPAROSCOPIC BILATERAL SALPINGECTOMY Bilateral 01/09/2021   Procedure: LAPAROSCOPIC BILATERAL SALPINGECTOMY;  Surgeon: All Ovid, MD;  Location: [facility];  Service: Gynecology;  Laterality: Bilateral;   LAPAROSCOPIC CHOLECYSTECTOMY  07/30/15   LEEP  2003   MYRINGOTOMY WITH TUBE PLACEMENT Left 08/23/2023   Procedure: LEFT MYRINGOTOMY WITH TUBE PLACEMENT;  Surgeon: Jesus Oliphant, MD;  Location: [facility];  Service: ENT;  Laterality: Left;    Family History  Problem Relation Age of Onset   Colon polyps Mother    Depression Mother    Cancer Father 44       Colon   Arthritis Father    Hypertension Father    Colon polyps Father 7   Colon cancer Father 23   Urticaria Sister    Eczema Sister    Arthritis Sister    Depression Brother    Drug abuse Brother    Hypertension Brother    Colon polyps Paternal Grandmother    Colon cancer Paternal Grandmother        dx when young and died   Esophageal cancer Neg Hx    Stomach cancer Neg Hx    Rectal cancer Neg Hx      Social Connections: Not on file     Current Medications[1]   Physical Exam:   There were no vitals taken for this visit.  Salient findings:  Unable to perform given phone visit  Seprately Identifiable Procedures:  Prior to initiating any procedures, risks/benefits/alternatives were explained to the patient and verbal consent obtained. PROCEDURE (Prior, not today): Bilateral Diagnostic Rigid Nasal Endoscopy Pre-procedure diagnosis: Concern for cerebrospinal fluid rhinorrhea, nasal mass Post-procedure diagnosis: same Indication: See pre-procedure diagnosis and physical exam above Complications: None apparent EBL: 0 mL Anesthesia: Lidocaine  4% and topical decongestant was topically sprayed in each  nasal cavity  Description of Procedure:  Patient was identified. A rigid 30 degree endoscope was utilized to evaluate the sinonasal cavities, mucosa, sinus ostia and turbinates and septum.  Overall, signs of mucosal inflammation are not noted.  No mucopurulence, polyps, noted.   Right Middle meatus: clear Right SE Recess: clear Left MM: clear Left SE Recess: clear Appears to have encephalocele v/s meningocele which is apparent likely from left olfactory cleft medial to left MT  Photodocumentation was obtained.  CPT CODE -- 68768 - Mod 25   Impression & Plans:  Rayel Trim is a 49 y.o. female with:  1. Nasal encephalocele (HCC)   2. Chronic nasal congestion   3. Cerebrospinal fluid rhinorrhea   4. IIH (idiopathic intracranial hypertension)   5. Hypertrophy of both inferior nasal turbinates   6. 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 B2 positive.  We discussed that given this and her other symptoms, she may have IIH which likely would need multimodal treatment from possible NSGY standpoint as well. 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  She has a follow up with Dr. Mansfield on  09/07/2024. Will let [facility] take over from here. Happy to help if needed  See below regarding exact medications prescribed this encounter including dosages and route: No orders of the defined types were placed in this encounter.     Thank you for allowing me the opportunity to care for your patient. Please do not hesitate to contact me should you have any other questions.  Sincerely, Eldora Blanch, MD Phone: [phone] Fax: [phone]  09/05/2024, 4:37 PM   MDM:  [id number] -- low, mod (independent imaging interpretation), low currently       [1]  Current Outpatient Medications:    amoxicillin -clavulanate (AUGMENTIN ) 875-125 MG tablet, Take 1 tablet by mouth 2 (two) times daily. (Patient not taking: Reported  on 08/21/2024), Disp: 20 tablet, Rfl: 0   azithromycin  (ZITHROMAX ) 250 MG tablet, 2 tabs poqday1, 1 tab poqday 2-5 (Patient not taking: Reported on 08/21/2024), Disp: 6 tablet, Rfl: 0   budesonide  (PULMICORT ) 0.5 MG/2ML nebulizer solution, Take 2 mLs (0.5 mg total) by nebulization in the morning and at bedtime. MIX 1 VIAL WITH 250CC OF NORMAL SALINE FOR SINUS WASH DAILY AS NEEDED, Disp: 120 mL, Rfl: 12   cetirizine  (ZYRTEC ) 10 MG tablet, Take 1 tablet (10 mg total) by mouth daily., Disp: 30 tablet, Rfl: 11   cyclobenzaprine  (FLEXERIL ) 10 MG tablet, Take 1 tablet (10 mg total) by mouth 3 (three) times daily as needed for muscle spasms., Disp: 30 tablet, Rfl: 0   escitalopram  (LEXAPRO ) 10 MG tablet, Take 1 tablet (10 mg total) by mouth daily., Disp: 90 tablet, Rfl: 1   ibuprofen  (ADVIL ) 200 MG tablet, Take 200-600 mg by mouth as needed., Disp: , Rfl:    ipratropium (ATROVENT ) 0.06 % nasal spray, Place 2 sprays into both nostrils 3 (three) times daily as needed for rhinitis., Disp: 15 mL, Rfl: 5   levocetirizine (XYZAL  ALLERGY  24HR) 5 MG tablet, Take 1 tablet (5 mg total) by mouth every evening., Disp: 90 tablet, Rfl: 1   mometasone  (ELOCON ) 0.1 % cream, Apply topically daily. (Patient taking differently: Apply topically as needed.), Disp: 15 g, Rfl: 1   Olopatadine-Mometasone  (RYALTRIS ) 665-25 MCG/ACT SUSP, Place 2 sprays into the nose in the morning and at bedtime., Disp: 29 g, Rfl: 5   predniSONE  (DELTASONE ) 20 MG tablet, 3 tabs poqday 1-2, 2 tabs poqday 3-4, 1 tab poqday 5-6, Disp: 12 tablet, Rfl: 0   valACYclovir (VALTREX) 500 MG tablet, Take 500 mg by mouth daily as needed (outbreaks). , Disp: , Rfl:    valsartan  (DIOVAN ) 160 MG tablet, Take 1 tablet (160 mg total) by mouth daily., Disp: 90 tablet, Rfl: 1

## 2024-09-07 ENCOUNTER — Ambulatory Visit: Payer: Self-pay | Admitting: Family Medicine

## 2024-09-07 DIAGNOSIS — G932 Benign intracranial hypertension: Secondary | ICD-10-CM | POA: Insufficient documentation

## 2024-09-07 DIAGNOSIS — G9601 Cranial cerebrospinal fluid leak, spontaneous: Secondary | ICD-10-CM | POA: Insufficient documentation

## 2024-09-07 DIAGNOSIS — J342 Deviated nasal septum: Secondary | ICD-10-CM | POA: Insufficient documentation

## 2024-09-07 DIAGNOSIS — Q018 Encephalocele of other sites: Secondary | ICD-10-CM | POA: Insufficient documentation

## 2024-09-07 NOTE — Progress Notes (Signed)
 [facility] RHINOLOGY, SINUS, & SKULL BASE SURGERY  HISTORY: Ashley Wagner is a 49 y.o. female who presents to the [facility] in consultation for encephalocele.  She is seen at the kind request of Dr. Eldora Blanch.  History obtained from the patient and her husband, D, who is present at the clinic visit.  She reports over one year of constant left-sided clear rhinorrhea -- worse with bending down and with activity.  She notes an associated salty and metallic taste.  She notes headaches -- left frontal / occipital.  She had Beta-2 Transferrin testing -- positive on 08/21/24.  Current symptoms include left-sided rhinorrhea, headache, and left nasal congestion.  She denies frequent sinus infections, nasal obstruction, facial pain, discolored nasal drainage, loss of smell, otorrhea, or epistaxis.    Symptoms began over one year ago.  Symptom severity is severe.  She has tried oral antibiotics, oral steroids, steroid nasal sprays, nasal saline spray, and oral antihistamines.  Improvement occurred with nothing.  Additional evaluation has included Sinus CT on 08/09/24 and Brain MRI 09/05/24.  Allergy  testing has been done and was negative.  No previous sinonasal surgery.  She has a history of left PE tube placement -- late 2024 by Dr. Jesus.    She is currently using intermittent Claritin-D and nasal saline spray.   She is not on any blood thinning medications.    REVIEW OF SYSTEMS: As per HPI.  The patient also completed a 14-system review of systems which was positive for swelling in legs and anxiety.  Otherwise negative review of systems.  REVIEW OF OLD/OUTSIDE RECORDS: Clinic Notes -- Dr. Blanch (09/05/24): Patient with over one year of left-sided clear nasal drainage -- worsened by leaning forward or bending.  Spontaneously with salty or metallic taste.  Intermittent left frontal headache, occasional facial pain, and intermittent nasal congestion.  No  previous sinus surgery.  Previous negative allergy  testing.  Previous trials of intranasal corticosteroids, Atrovent , PO antihistamines, and nasal rinses.  Previous treatment with oral antibiotics and steroids. History of left-sided ear drainage -- PE tube placed about one year ago.  Gray mass medial to the left middle turbinate.  S/P MRI.  Beta-2 Transferrin was positive for CSF.  Imaging with left cribriform encephalocele. Possible small left mastoid encephalocele.  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  Noted NSD and ITH.   B2 Transferrin 08/21/2024: positive  Radiology Report -- Sinus CT (08/09/24): 1. Relatively Large left superior/middle nasal meatus polypoid mass (12 x 14 x 23 mm), indeterminant but suspicious for possible Dehiscence and left frontal lobe Encephalocele. Recommend Brain MRI without and with contrast - including thin slice coronal T2 images (such as used with seizure protocol) - to further characterize prior to any operative procedure. 2. Underlying Hyperplastic ethmoid, maxillary, and sphenoid paranasal sinuses with bilateral Onodi cells. 3. Right maxillary sinus disease, mild residual mucosal thickening now mostly at the alveolar recess. Radiology Report -- Brain MRI (09/05/24): 1. Anterior cephalocele extending into the left ethmoid air cells.   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 Allergies[4] Current Medications[5] BP 157/84   Pulse 73   Ht 1.727 m (5' 8)   Wt 99.8 kg (  220 lb)   SpO2 99%   BMI 33.45 kg/m   PHYSICAL EXAM:  General Appearance: Pleasant, well-developed, well-nourished patient in no apparent distress. Mental status is normal.  Breathing quietly, comfortably, no stridor or wheezing. Head and Face: No skin  lesions, face symmetric, sensation normal.   Eyes: Extraocular muscles intact.  Pupils equal, round and reactive to light.   Ears: External ears normal to inspection and palpation, canals clear, TMs intact, no middle effusion.   Nose: Anterior rhinoscopy reveals nasal septal deviation and inferior turbinate hypertrophy.  Nasal endoscopy was indicated to better evaluate the nose and paranasal sinuses, given the patient's history and exam findings, and is detailed below. Oral Cavity/Oropharynx: No masses or lesions of lips, gums, tongue, floor of mouth, buccal mucosa, hard palate or soft palate.  Dentition is good.  No erythema, exudate, or tonsillar masses.  Posterior pharyngeal wall is normal.   Neck/Lymphatic: No neck masses or lesions palpable.  Thyroid is midline without fullness or nodules. Neurologic: Cranial nerves II-XII intact.    PROCEDURE: Diagnostic Nasal Endoscopy Nasal/Sinus Endoscopy  Date/Time: 09/07/2024 3:00 PM  Performed by: Norleen JONETTA Sample, MD Authorized by: Norleen JONETTA Sample, MD  Local anesthesia used: yes  Anesthesia: Local anesthesia used: yes Local Anesthetic: topical anesthetic  Sedation: Patient sedated: no  Patient tolerance: patient tolerated the procedure well with no immediate complications Comments: Anesthesia: Lidocaine  4% topical anesthetic was placed. Description of Procedure:  A rigid endoscope was utilized to evaluate the sinonasal cavities, mucosa, sinus ostia and turbinates.  Overall, signs of mucosal inflammation are noted.  Also noted are nasal septal deviation and inferior turbinate hypertrophy.  Noted let-sided septal spur in contact with the inferior turbinate.  Clear right middle meatus and sphenoethmoid recess.  Clear left sphenoethmoid recess.  Left middle meatus with large gray polypoid lesion -- pulsatile.  Clear nasopharynx bilaterally.  No mucopurulence noted.  Photodocumentation was obtained.  CPT CODE -- [id number]    RADIOGRAPHIC  EVALUATION: Sinus CT (08/09/24):     Brain MRI (09/05/24): My interpretation of the sinonasal portion of this imaging study shows nasal septal deviation and inferior turbinate hypertrophy.  Clear frontal and sphenoid sinuses bilaterally.  Clear right ethmoid sinuses.  T2 bright lesion in the left ethmoid cavity consistent with encephalocele.  Noted mild mucosal thickening of the left maxillary sinus.     ASSESSMENT:  CSF Rhinorrhea -- over one year of symptoms.  Beta-2 transferrin positive.  Imaging with noted left ethmoid encephalocele.  We discussed her surgical options including endoscopic excision of encephalocele and CSF leak repair.  We discussed Neurosurgery evaluation for assistance with lumbar drain with Fluorescein and encephalocele excision and CSF leak repair.   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  We reviewed her nasal endoscopy and Brain MRI images together.  The risks, benefits and alternatives were discussed and questions answered.  She has elected to proceed with Endoscopic excision of left sinonasal encephalocele, Endoscopic repair of ethmoid CSF leak, Endoscopic sinus surgery, Septoplasty, Possible nasoseptal flap, Possible abdominal fat graft, and Lumbar Drain with Fluorescein.  The role of nasal/sinus surgery, endoscopic skull base surgery, and septoplasty was discussed.  Risks including pain, bleeding, infection, anesthetic risks, olfactory dysfunction, recurrence of symptoms, septal perforation, stroke, death, and injury to associated structures, including the orbit and cranial cavity among others, were discussed and all questions answered.  The need for postoperative care, including debridement, and ongoing medical management was discussed.  After  considering the risks, benefits, and alternatives; surgical management was elected.  1) Obtain Local Sinus CT images for review. 2) Neurosurgery  evaluation -- Dr. Quinton 3) Monitor for warning signs of meningitis: Fever, Worsening headache, Lethargy, Neck stiffness, or Sensitivity to light.   4) Our surgery scheduler will contact you regarding arrangements for surgical procedure.   She will contact us  sooner with questions or concerns as needed.   [id number] [phone]          [1] Past Medical History: Diagnosis Date   Anxiety    High blood pressure   [2] History reviewed. No pertinent surgical history. [3] No family history on file. [4] No Known Allergies [5] Current Outpatient Medications  Medication Sig Dispense Refill   cyclobenzaprine  (FLEXERIL ) 10 mg tablet Take 10 mg by mouth 3 (three) times a day as needed for muscle spasms.     escitalopram  (LEXAPRO ) 10 mg tablet Take 10 mg by mouth daily.     ibuprofen  (MOTRIN ) 200 mg tablet Take 200-600 mg by mouth.     valACYclovir (VALTREX) 500 mg tablet Take 500 mg by mouth.     valsartan  (DIOVAN ) 160 mg tablet Take 160 mg by mouth daily.     triamcinolone  acetonide (NASACORT  AQ) 55 mcg spra spray PLACE 2 SPRAYS INTO THE NOSE DAILY. (Patient not taking: Reported on 09/07/2024)     No current facility-administered medications for this visit.

## 2024-09-11 ENCOUNTER — Ambulatory Visit: Payer: BC Managed Care – PPO | Admitting: Family Medicine

## 2024-09-11 VITALS — BP 120/60 | HR 90 | Temp 97.9°F | Ht 68.0 in | Wt 230.8 lb

## 2024-09-11 DIAGNOSIS — M5412 Radiculopathy, cervical region: Secondary | ICD-10-CM

## 2024-09-11 DIAGNOSIS — G932 Benign intracranial hypertension: Secondary | ICD-10-CM

## 2024-09-11 DIAGNOSIS — I10 Essential (primary) hypertension: Secondary | ICD-10-CM

## 2024-09-11 DIAGNOSIS — Z23 Encounter for immunization: Secondary | ICD-10-CM | POA: Diagnosis not present

## 2024-09-11 DIAGNOSIS — Z0001 Encounter for general adult medical examination with abnormal findings: Secondary | ICD-10-CM

## 2024-09-11 DIAGNOSIS — Q019 Encephalocele, unspecified: Secondary | ICD-10-CM | POA: Diagnosis not present

## 2024-09-11 DIAGNOSIS — Z0000 Encounter for general adult medical examination without abnormal findings: Secondary | ICD-10-CM

## 2024-09-11 NOTE — Progress Notes (Signed)
 "  Subjective:    Patient ID: Taylor L Degroote, female    DOB: October 02, 1975, 49 y.o.   MRN: [id number]  HPI  Patient is a very pleasant 49 year old African-American female who presents today for complete physical exam.  Last colonoscopy was 2022 and they recommended 5 years due to history of colon cancer in father.    Due to chronic rhinorrhea and chronic sinusitis, the patient was referred to ENT and was subsequently found to have an encephalocele on MRI: BRAIN AND VENTRICLES: No acute infarct. No acute intracranial hemorrhage. No mass effect or midline shift. No hydrocephalus. The sella is unremarkable. Normal flow voids. An anterior cephalocele extends into the left ethmoid air cells. The opening is approximately 10 x 7 mm. Predominantly fluid containing cephalocele extends inferiorly within the left ethmoid air cells 2.7 cm.  She has been referred to neurosurgery at [facility].  Patient was also having cervical radiculopathy.  An MRI was obtained of her cervical spine which showed possible left-sided nerve impingement at C6-C7:  IMPRESSION: 1. Multilevel disc degeneration, greatest at C6-c7 where there is mild to moderate spinal stenosis and mild right and moderate left neural foraminal stenosis. 2. Mild right and moderate left neural foraminal stenosis at C7-T1. 3. Partially empty sella and enlargement of Meckel caves, which can be seen with idiopathic intracranial hypertension.  Patient is here today for a physical exam.  She gets her mammogram and her Pap smear through her gynecologist.  Her colonoscopy is stable earlier is up-to-date.  She has an appointment to see neurosurgery later this week.  Past Medical History:  Diagnosis Date   Diverticulosis of colon    GERD (gastroesophageal reflux disease)    on meds- and with certain foods   History of cervical dysplasia 2003   CIN I and II  s/p LEEP   History of chlamydia 2000   treated   HSV-2 infection    Hypertension    on meds    Menorrhagia    Uterine fibroid    Wears partial dentures    upper   Past Surgical History:  Procedure Laterality Date   CESAREAN SECTION  2004   COLONOSCOPY WITH ESOPHAGOGASTRODUODENOSCOPY (EGD)  2017  dr abran   DILATATION & CURETTAGE/HYSTEROSCOPY WITH TRUECLEAR N/A 09/07/2014   Procedure: DILATATION & CURETTAGE/HYSTEROSCOPY WITH TRUCLEAR;  Surgeon: Ovid All, MD;  Location: [facility];  Service: Gynecology;  Laterality: N/A;   DILITATION & CURRETTAGE/HYSTROSCOPY WITH NOVASURE ABLATION N/A 01/09/2021   Procedure: HYSTEROSCOPY WITH HYDROTHERMAL ABLATION;  Surgeon: All Ovid, MD;  Location: [facility];  Service: Gynecology;  Laterality: N/A;   LAPAROSCOPIC BILATERAL SALPINGECTOMY Bilateral 01/09/2021   Procedure: LAPAROSCOPIC BILATERAL SALPINGECTOMY;  Surgeon: All Ovid, MD;  Location: [facility];  Service: Gynecology;  Laterality: Bilateral;   LAPAROSCOPIC CHOLECYSTECTOMY  07/30/15   LEEP  2003   MYRINGOTOMY WITH TUBE PLACEMENT Left 08/23/2023   Procedure: LEFT MYRINGOTOMY WITH TUBE PLACEMENT;  Surgeon: Jesus Oliphant, MD;  Location: [facility];  Service: ENT;  Laterality: Left;   Current Outpatient Medications on File Prior to Visit  Medication Sig Dispense Refill   amoxicillin -clavulanate (AUGMENTIN ) 875-125 MG tablet Take 1 tablet by mouth 2 (two) times daily. (Patient not taking: Reported on 08/21/2024) 20 tablet 0   azithromycin  (ZITHROMAX ) 250 MG tablet 2 tabs poqday1, 1 tab poqday 2-5 (Patient not taking: Reported on 08/21/2024) 6 tablet 0   budesonide  (PULMICORT ) 0.5 MG/2ML nebulizer solution Take 2 mLs (0.5 mg total) by nebulization in  the morning and at bedtime. MIX 1 VIAL WITH 250CC OF NORMAL SALINE FOR SINUS WASH DAILY AS NEEDED 120 mL 12   cetirizine  (ZYRTEC ) 10 MG tablet Take 1 tablet (10 mg total) by mouth daily. 30 tablet 11   cyclobenzaprine  (FLEXERIL ) 10 MG tablet Take 1 tablet (10 mg total) by mouth 3 (three) times  daily as needed for muscle spasms. 30 tablet 0   escitalopram  (LEXAPRO ) 10 MG tablet Take 1 tablet (10 mg total) by mouth daily. 90 tablet 1   ibuprofen  (ADVIL ) 200 MG tablet Take 200-600 mg by mouth as needed.     ipratropium (ATROVENT ) 0.06 % nasal spray Place 2 sprays into both nostrils 3 (three) times daily as needed for rhinitis. 15 mL 5   levocetirizine (XYZAL  ALLERGY  24HR) 5 MG tablet Take 1 tablet (5 mg total) by mouth every evening. 90 tablet 1   mometasone  (ELOCON ) 0.1 % cream Apply topically daily. (Patient taking differently: Apply topically as needed.) 15 g 1   Olopatadine-Mometasone  (RYALTRIS ) 665-25 MCG/ACT SUSP Place 2 sprays into the nose in the morning and at bedtime. 29 g 5   predniSONE  (DELTASONE ) 20 MG tablet 3 tabs poqday 1-2, 2 tabs poqday 3-4, 1 tab poqday 5-6 12 tablet 0   valACYclovir (VALTREX) 500 MG tablet Take 500 mg by mouth daily as needed (outbreaks).      valsartan  (DIOVAN ) 160 MG tablet Take 1 tablet (160 mg total) by mouth daily. 90 tablet 1   No current facility-administered medications on file prior to visit.   No Known Allergies Social History   Socioeconomic History   Marital status: Married    Spouse name: Not on file   Number of children: 1   Years of education: Not on file   Highest education level: Not on file  Occupational History   Occupation: retail banker  Tobacco Use   Smoking status: Former    Current packs/day: 0.00    Types: Cigarettes    Start date: 08/27/1997    Quit date: 08/27/2001    Years since quitting: 23.0   Smokeless tobacco: Never  Vaping Use   Vaping status: Never Used  Substance and Sexual Activity   Alcohol use: Yes    Alcohol/week: 1.0 standard drink of alcohol    Types: 1 Standard drinks or equivalent per week    Comment: occasional   Drug use: Never   Sexual activity: Yes    Partners: Male    Birth control/protection: Pill  Other Topics Concern   Not on file  Social History Narrative   Not on  file   Social Drivers of Health   Tobacco Use: Medium Risk (09/05/2024)   Patient History    Smoking Tobacco Use: Former    Smokeless Tobacco Use: Never    Passive Exposure: Not on Actuary Strain: Not on file  Food Insecurity: Low Risk (06/08/2023)   Received from [facility]   Epic    Within the past 12 months, you worried that your food would run out before you got money to buy more: Never true    Within the past 12 months, the food you bought just didn't last and you didn't have money to get more. : Never true  Transportation Needs: No Transportation Needs (06/08/2023)   Received from [facility]    In the past 12 months, has lack of reliable transportation kept you from medical appointments, meetings, work or from getting things needed for  daily living? : No  Physical Activity: Not on file  Stress: Not on file  Social Connections: Not on file  Intimate Partner Violence: Not on file  Depression (PHQ2-9): Low Risk (02/14/2024)   Depression (PHQ2-9)    PHQ-2 Score: 0  Alcohol Screen: Not on file  Housing: Low Risk (06/08/2023)   Received from [facility]   Epic    What is your living situation today?: I have a steady place to live    Think about the place you live. Do you have problems with any of the following? Choose all that apply:: None/None on this list  Utilities: Low Risk (06/08/2023)   Received from [facility]   Utilities    In the past 12 months has the electric, gas, oil, or water company threatened to shut off services in your home? : No  Health Literacy: Not on file      Review of Systems  All other systems reviewed and are negative.      Objective:   Physical Exam Vitals reviewed.  Constitutional:      General: She is not in acute distress.    Appearance: She is well-developed. She is not diaphoretic.  HENT:     Head: Normocephalic and atraumatic.     Right Ear: External ear normal.     Left Ear: External  ear normal.     Nose: Nose normal.     Mouth/Throat:     Pharynx: No oropharyngeal exudate.  Eyes:     General: No scleral icterus.       Right eye: No discharge.        Left eye: No discharge.     Conjunctiva/sclera: Conjunctivae normal.     Pupils: Pupils are equal, round, and reactive to light.  Neck:     Thyroid: No thyromegaly.     Vascular: No JVD.     Trachea: No tracheal deviation.  Cardiovascular:     Rate and Rhythm: Normal rate and regular rhythm.     Heart sounds: Normal heart sounds. No murmur heard.    No friction rub.  Pulmonary:     Effort: Pulmonary effort is normal. No respiratory distress.     Breath sounds: Normal breath sounds. No stridor. No wheezing or rales.  Chest:     Chest wall: No tenderness.  Abdominal:     General: Bowel sounds are normal. There is no distension.     Palpations: Abdomen is soft. There is no mass.     Tenderness: There is no guarding or rebound.  Musculoskeletal:        General: No tenderness or deformity. Normal range of motion.     Cervical back: Normal range of motion and neck supple.  Lymphadenopathy:     Cervical: No cervical adenopathy.  Skin:    General: Skin is warm.     Coloration: Skin is not pale.     Findings: No erythema or rash.  Neurological:     Mental Status: She is alert and oriented to person, place, and time.     Cranial Nerves: No cranial nerve deficit.     Motor: No abnormal muscle tone.     Coordination: Coordination normal.     Deep Tendon Reflexes: Reflexes are normal and symmetric. Reflexes normal.  Psychiatric:        Behavior: Behavior normal.        Thought Content: Thought content normal.        Judgment: Judgment normal.  Assessment & Plan:  Intracranial hypertension - Plan: Ambulatory referral to Neurology  Need for vaccination - Plan: Flu vaccine trivalent PF, 6mos and older(Flulaval,Afluria,Fluarix,Fluzone)  Encephalocele (HCC)  Cervical radiculopathy  Primary  hypertension  General medical exam I suspect that the patient has idiopathic intracranial hypertension and this led to the encephalocele.  Patient has an appointment to see neurosurgery to excise the damaged brain tissue and to patch the hole in the ethmoid sinuses that is leaking cerebrospinal fluid.  However I do not see a plan to treat the intracranial hypertension.  I believe the patient may be a candidate for acetazolamide.  However I would like her to see neurology for a second opinion.  Therefore I will place the referral for this.  The patient is up-to-date on her immunizations except the flu shot.  She received that today.  Blood pressure is acceptable.  Lab work below is outstanding Lab on 09/04/2024  Component Date Value Ref Range Status   WBC 09/05/2024 7.2  3.8 - 10.8 Thousand/uL Final   RBC 09/05/2024 4.15  3.80 - 5.10 Million/uL Final   Hemoglobin 09/05/2024 12.7  11.7 - 15.5 g/dL Final   HCT 98/86/7973 40.1  35.9 - 46.0 % Final   MCV 09/05/2024 96.6  81.4 - 101.7 fL Final   MCH 09/05/2024 30.6  27.0 - 33.0 pg Final   MCHC 09/05/2024 31.7  31.6 - 35.4 g/dL Final   RDW 98/86/7973 12.1  11.0 - 15.0 % Final   Platelets 09/05/2024 354  140 - 400 Thousand/uL Final   MPV 09/05/2024 10.1  7.5 - 12.5 fL Final   Neutro Abs 09/05/2024 4,111  1,500 - 7,800 cells/uL Final   Absolute Lymphocytes 09/05/2024 2,340  850 - 3,900 cells/uL Final   Absolute Monocytes 09/05/2024 432  200 - 950 cells/uL Final   Eosinophils Absolute 09/05/2024 288  15 - 500 cells/uL Final   Basophils Absolute 09/05/2024 29  0 - 200 cells/uL Final   Neutrophils Relative % 09/05/2024 57.1  % Final   Total Lymphocyte 09/05/2024 32.5  % Final   Monocytes Relative 09/05/2024 6.0  % Final   Eosinophils Relative 09/05/2024 4.0  % Final   Basophils Relative 09/05/2024 0.4  % Final   Glucose, Bld 09/05/2024 84  65 - 99 mg/dL Final   Comment: .            Fasting reference interval .    BUN 09/05/2024 12  7 - 25 mg/dL  Final   Creat 98/86/7973 0.87  0.50 - 0.99 mg/dL Final   BUN/Creatinine Ratio 09/05/2024 SEE NOTE:  6 - 22 (calc) Final   Comment:    Not Reported: BUN and Creatinine are within    reference range. .    Sodium 09/05/2024 141  135 - 146 mmol/L Final   Potassium 09/05/2024 4.8  3.5 - 5.3 mmol/L Final   Chloride 09/05/2024 105  98 - 110 mmol/L Final   CO2 09/05/2024 29  20 - 32 mmol/L Final   Calcium 09/05/2024 8.9  8.6 - 10.2 mg/dL Final   Total Protein 98/86/7973 6.8  6.1 - 8.1 g/dL Final   Albumin 98/86/7973 3.9  3.6 - 5.1 g/dL Final   Globulin 98/86/7973 2.9  1.9 - 3.7 g/dL (calc) Final   AG Ratio 09/05/2024 1.3  1.0 - 2.5 (calc) Final   Total Bilirubin 09/05/2024 0.4  0.2 - 1.2 mg/dL Final   Alkaline phosphatase (APISO) 09/05/2024 66  31 - 125 U/L Final   AST  09/05/2024 15  10 - 35 U/L Final   ALT 09/05/2024 12  6 - 29 U/L Final   Cholesterol 09/05/2024 181  <200 mg/dL Final   HDL 98/86/7973 74  > OR = 50 mg/dL Final   Triglycerides 98/86/7973 55  <150 mg/dL Final   LDL Cholesterol (Calc) 09/05/2024 93  mg/dL (calc) Final   Comment: Reference range: <100 . Desirable range <100 mg/dL for primary prevention;   <70 mg/dL for patients with CHD or diabetic patients  with > or = 2 CHD risk factors. SABRA LDL-C is now calculated using the Martin-Hopkins  calculation, which is a validated novel method providing  better accuracy than the Friedewald equation in the  estimation of LDL-C.  Gladis APPLETHWAITE et al. SANDREA. 7986;689(80): 2061-2068  (http://education.[facility].com/faq/FAQ164)    Total CHOL/HDL Ratio 09/05/2024 2.4  <4.9 (calc) Final   Non-HDL Cholesterol (Calc) 09/05/2024 107  <130 mg/dL (calc) Final   Comment: For patients with diabetes plus 1 major ASCVD risk  factor, treating to a non-HDL-C goal of <100 mg/dL  (LDL-C of <29 mg/dL) is considered a therapeutic  option.   Hospital Outpatient Visit on 08/22/2024  Component Date Value Ref Range Status   [facility] test code  08/22/2024 9985   Final   [facility] test name 08/22/2024 BETA 2 TRANSFERRIN   Final   Source ([facility]) 08/22/2024 CSF, QUEST CODE [id number]   Corrected   Misc [facility] result 08/22/2024 COMMENT   Final   Comment: See Scanned report in [place] Link (NOTE) Test Ordered: 990014 Miscellaneous Testing Referral Test Name             Comment                   AE   Beta-2-Transferrin Referral Lab                   Comment                   AE   [facility] Referral Test Code or Mnemonic Comment                   AE   [id number] Referral Test Results          Comment                   AE   PDF report to be sent separately PDF                            .                         BN   Performed At: [facility] [address] [place], [place] [id number] Jennette Shorter MD Ey:[id number] Performed At: [facility] [address] [place], [place] [id number] Jonette Belvie ORN MD Ey:[id number] Performed at [facility], [address], [place], [place] [address]     "

## 2024-09-12 ENCOUNTER — Ambulatory Visit: Admitting: Internal Medicine

## 2024-10-11 ENCOUNTER — Ambulatory Visit: Admitting: Internal Medicine

## 2025-09-10 ENCOUNTER — Other Ambulatory Visit

## 2025-09-13 ENCOUNTER — Encounter: Admitting: Family Medicine
